# Patient Record
Sex: Female | Born: 1960 | Race: White | Hispanic: No | Marital: Married | Smoking: Never smoker
Health system: Southern US, Community
[De-identification: ages and names within clinical notes are randomized; demographics above are authoritative.]

## PROBLEM LIST (undated history)

## (undated) DIAGNOSIS — G4733 Obstructive sleep apnea (adult) (pediatric): Secondary | ICD-10-CM

## (undated) DIAGNOSIS — M8580 Other specified disorders of bone density and structure, unspecified site: Secondary | ICD-10-CM

## (undated) DIAGNOSIS — I83893 Varicose veins of bilateral lower extremities with other complications: Secondary | ICD-10-CM

## (undated) DIAGNOSIS — F329 Major depressive disorder, single episode, unspecified: Secondary | ICD-10-CM

## (undated) DIAGNOSIS — E079 Disorder of thyroid, unspecified: Secondary | ICD-10-CM

## (undated) DIAGNOSIS — E785 Hyperlipidemia, unspecified: Secondary | ICD-10-CM

## (undated) DIAGNOSIS — F32A Depression, unspecified: Secondary | ICD-10-CM

## (undated) DIAGNOSIS — K5790 Diverticulosis of intestine, part unspecified, without perforation or abscess without bleeding: Secondary | ICD-10-CM

## (undated) DIAGNOSIS — K8020 Calculus of gallbladder without cholecystitis without obstruction: Secondary | ICD-10-CM

## (undated) DIAGNOSIS — E669 Obesity, unspecified: Secondary | ICD-10-CM

## (undated) DIAGNOSIS — N6019 Diffuse cystic mastopathy of unspecified breast: Secondary | ICD-10-CM

## (undated) DIAGNOSIS — G2581 Restless legs syndrome: Secondary | ICD-10-CM

## (undated) DIAGNOSIS — J302 Other seasonal allergic rhinitis: Secondary | ICD-10-CM

## (undated) DIAGNOSIS — M1990 Unspecified osteoarthritis, unspecified site: Secondary | ICD-10-CM

## (undated) DIAGNOSIS — N319 Neuromuscular dysfunction of bladder, unspecified: Secondary | ICD-10-CM

## (undated) HISTORY — PX: LUMBAR SPINE SURGERY: SHX701

## (undated) HISTORY — DX: Obstructive sleep apnea (adult) (pediatric): G47.33

## (undated) HISTORY — DX: Varicose veins of bilateral lower extremities with other complications: I83.893

## (undated) HISTORY — DX: Neuromuscular dysfunction of bladder, unspecified: N31.9

## (undated) HISTORY — DX: Disorder of thyroid, unspecified: E07.9

## (undated) HISTORY — PX: FOOT SURGERY: SHX648

## (undated) HISTORY — DX: Obesity, unspecified: E66.9

## (undated) HISTORY — DX: Depression, unspecified: F32.A

## (undated) HISTORY — DX: Other specified disorders of bone density and structure, unspecified site: M85.80

## (undated) HISTORY — DX: Calculus of gallbladder without cholecystitis without obstruction: K80.20

## (undated) HISTORY — DX: Diverticulosis of intestine, part unspecified, without perforation or abscess without bleeding: K57.90

## (undated) HISTORY — PX: CHOLECYSTECTOMY: SHX55

## (undated) HISTORY — PX: ERCP: SHX60

## (undated) HISTORY — DX: Other seasonal allergic rhinitis: J30.2

## (undated) HISTORY — DX: Diffuse cystic mastopathy of unspecified breast: N60.19

## (undated) HISTORY — DX: Restless legs syndrome: G25.81

## (undated) HISTORY — DX: Major depressive disorder, single episode, unspecified: F32.9

## (undated) HISTORY — DX: Hyperlipidemia, unspecified: E78.5

## (undated) HISTORY — DX: Unspecified osteoarthritis, unspecified site: M19.90

---

## 1988-09-06 HISTORY — PX: APPENDECTOMY: SHX54

## 1989-07-07 HISTORY — PX: OTHER SURGICAL HISTORY: SHX169

## 1997-09-06 HISTORY — PX: OTHER SURGICAL HISTORY: SHX169

## 1997-11-26 ENCOUNTER — Ambulatory Visit (HOSPITAL_COMMUNITY): Admission: RE | Admit: 1997-11-26 | Discharge: 1997-11-26 | Payer: Self-pay | Admitting: *Deleted

## 1997-12-03 ENCOUNTER — Other Ambulatory Visit: Admission: RE | Admit: 1997-12-03 | Discharge: 1997-12-03 | Payer: Self-pay | Admitting: *Deleted

## 1998-02-11 ENCOUNTER — Other Ambulatory Visit: Admission: RE | Admit: 1998-02-11 | Discharge: 1998-02-11 | Payer: Self-pay | Admitting: *Deleted

## 1998-11-12 ENCOUNTER — Other Ambulatory Visit: Admission: RE | Admit: 1998-11-12 | Discharge: 1998-11-12 | Payer: Self-pay | Admitting: *Deleted

## 1999-11-26 ENCOUNTER — Other Ambulatory Visit: Admission: RE | Admit: 1999-11-26 | Discharge: 1999-11-26 | Payer: Self-pay | Admitting: *Deleted

## 2000-10-25 ENCOUNTER — Other Ambulatory Visit: Admission: RE | Admit: 2000-10-25 | Discharge: 2000-10-25 | Payer: Self-pay | Admitting: *Deleted

## 2000-11-29 ENCOUNTER — Encounter: Admission: RE | Admit: 2000-11-29 | Discharge: 2001-02-27 | Payer: Self-pay | Admitting: Internal Medicine

## 2001-05-03 ENCOUNTER — Encounter: Payer: Self-pay | Admitting: *Deleted

## 2001-05-03 ENCOUNTER — Ambulatory Visit (HOSPITAL_COMMUNITY): Admission: RE | Admit: 2001-05-03 | Discharge: 2001-05-03 | Payer: Self-pay | Admitting: *Deleted

## 2001-07-25 ENCOUNTER — Encounter: Admission: RE | Admit: 2001-07-25 | Discharge: 2001-10-23 | Payer: Self-pay | Admitting: Internal Medicine

## 2002-12-03 ENCOUNTER — Other Ambulatory Visit: Admission: RE | Admit: 2002-12-03 | Discharge: 2002-12-03 | Payer: Self-pay | Admitting: *Deleted

## 2003-04-18 ENCOUNTER — Ambulatory Visit (HOSPITAL_COMMUNITY): Admission: RE | Admit: 2003-04-18 | Discharge: 2003-04-18 | Payer: Self-pay | Admitting: *Deleted

## 2003-04-18 ENCOUNTER — Encounter: Payer: Self-pay | Admitting: *Deleted

## 2003-04-23 ENCOUNTER — Encounter: Admission: RE | Admit: 2003-04-23 | Discharge: 2003-04-23 | Payer: Self-pay | Admitting: *Deleted

## 2003-04-23 ENCOUNTER — Encounter: Payer: Self-pay | Admitting: *Deleted

## 2003-12-24 ENCOUNTER — Other Ambulatory Visit: Admission: RE | Admit: 2003-12-24 | Discharge: 2003-12-24 | Payer: Self-pay | Admitting: *Deleted

## 2004-01-16 ENCOUNTER — Encounter: Admission: RE | Admit: 2004-01-16 | Discharge: 2004-01-16 | Payer: Self-pay | Admitting: Internal Medicine

## 2004-05-04 ENCOUNTER — Ambulatory Visit (HOSPITAL_COMMUNITY): Admission: RE | Admit: 2004-05-04 | Discharge: 2004-05-04 | Payer: Self-pay | Admitting: *Deleted

## 2004-12-16 ENCOUNTER — Other Ambulatory Visit: Admission: RE | Admit: 2004-12-16 | Discharge: 2004-12-16 | Payer: Self-pay | Admitting: *Deleted

## 2005-05-05 ENCOUNTER — Ambulatory Visit (HOSPITAL_COMMUNITY): Admission: RE | Admit: 2005-05-05 | Discharge: 2005-05-05 | Payer: Self-pay | Admitting: *Deleted

## 2006-02-14 ENCOUNTER — Other Ambulatory Visit: Admission: RE | Admit: 2006-02-14 | Discharge: 2006-02-14 | Payer: Self-pay | Admitting: *Deleted

## 2006-05-06 ENCOUNTER — Ambulatory Visit (HOSPITAL_COMMUNITY): Admission: RE | Admit: 2006-05-06 | Discharge: 2006-05-06 | Payer: Self-pay | Admitting: Family Medicine

## 2007-01-25 ENCOUNTER — Other Ambulatory Visit: Admission: RE | Admit: 2007-01-25 | Discharge: 2007-01-25 | Payer: Self-pay | Admitting: *Deleted

## 2007-01-26 ENCOUNTER — Encounter: Admission: RE | Admit: 2007-01-26 | Discharge: 2007-01-26 | Payer: Self-pay | Admitting: Internal Medicine

## 2007-04-26 ENCOUNTER — Ambulatory Visit: Payer: Self-pay | Admitting: Internal Medicine

## 2007-05-02 ENCOUNTER — Ambulatory Visit (HOSPITAL_COMMUNITY): Admission: RE | Admit: 2007-05-02 | Discharge: 2007-05-02 | Payer: Self-pay | Admitting: Internal Medicine

## 2007-05-09 ENCOUNTER — Ambulatory Visit (HOSPITAL_COMMUNITY): Admission: RE | Admit: 2007-05-09 | Discharge: 2007-05-09 | Payer: Self-pay | Admitting: *Deleted

## 2007-12-08 DIAGNOSIS — E039 Hypothyroidism, unspecified: Secondary | ICD-10-CM | POA: Insufficient documentation

## 2008-05-20 ENCOUNTER — Ambulatory Visit (HOSPITAL_COMMUNITY): Admission: RE | Admit: 2008-05-20 | Discharge: 2008-05-20 | Payer: Self-pay | Admitting: Internal Medicine

## 2008-06-10 ENCOUNTER — Ambulatory Visit: Payer: Self-pay | Admitting: Sports Medicine

## 2008-06-10 DIAGNOSIS — M7040 Prepatellar bursitis, unspecified knee: Secondary | ICD-10-CM | POA: Insufficient documentation

## 2008-06-10 DIAGNOSIS — M2170 Unequal limb length (acquired), unspecified site: Secondary | ICD-10-CM | POA: Insufficient documentation

## 2008-06-10 DIAGNOSIS — M533 Sacrococcygeal disorders, not elsewhere classified: Secondary | ICD-10-CM | POA: Insufficient documentation

## 2008-06-14 ENCOUNTER — Ambulatory Visit: Payer: Self-pay | Admitting: Sports Medicine

## 2008-06-14 DIAGNOSIS — M76899 Other specified enthesopathies of unspecified lower limb, excluding foot: Secondary | ICD-10-CM | POA: Insufficient documentation

## 2008-06-14 DIAGNOSIS — M25469 Effusion, unspecified knee: Secondary | ICD-10-CM | POA: Insufficient documentation

## 2008-08-26 ENCOUNTER — Emergency Department (HOSPITAL_COMMUNITY): Admission: EM | Admit: 2008-08-26 | Discharge: 2008-08-26 | Payer: Self-pay | Admitting: Emergency Medicine

## 2009-05-23 ENCOUNTER — Ambulatory Visit (HOSPITAL_COMMUNITY): Admission: RE | Admit: 2009-05-23 | Discharge: 2009-05-23 | Payer: Self-pay | Admitting: Obstetrics and Gynecology

## 2010-06-25 ENCOUNTER — Ambulatory Visit (HOSPITAL_COMMUNITY): Admission: RE | Admit: 2010-06-25 | Discharge: 2010-06-25 | Payer: Self-pay | Admitting: Obstetrics and Gynecology

## 2010-09-26 ENCOUNTER — Encounter: Payer: Self-pay | Admitting: Obstetrics and Gynecology

## 2011-01-19 NOTE — Assessment & Plan Note (Signed)
[facility]                         GASTROENTEROLOGY OFFICE NOTE   NAME:Deborah Perkins, Deborah Perkins                         MRN:          [id number]  DATE:05/03/2007                            DOB:          February 04, 1961    Ms. Deshmukh is a 50 year old white female whom we saw in consultation from  Dr. Felipa Eth on April 26, 2007 because of intermittent abdominal  discomfort.  I dictated my consultation note at that time.  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  On x-ray her abdominal  discomfort was correlating with descending colon and adjacent small  bowels.  There was no evidence of obstruction, and the transit  time was  normal.   I have spoken to the patient today and explained the results of her  small bowel followthrough.her  pain is  in some way related to the  malrotation of the small bowel possibly due to adhesions or due to  previous LADDS procedure which might have created some bowel adhesion.  She reported improvement of the pain with Bentyl , an antispasmodic.  I  have told her that as long as there is no obstruction nothing has to be  done, but over a period of  next six months or year if her pain becomes  progressively worse or more frequent that we will have to evaluate her  further.  I would prefer to wait with colonoscopy until she is 50 years  old, but if the pain on the left side continues I would consider  colonoscopy to rule out any structural problems due to the left colon.     [name], MD  Electronically Signed    DMB/MedQ  DD: 05/03/2007  DT: 05/04/2007  Job #: [id number]   cc:   Larina Earthly, M.D.

## 2011-01-19 NOTE — Assessment & Plan Note (Signed)
[facility]                         GASTROENTEROLOGY OFFICE NOTE   NAME:Perkins, Deborah SMART                         MRN:          [id number]  DATE:04/26/2007                            DOB:          07/10/1961    Deborah Perkins is a very nice 50 year old white female who is here today for  evaluation of left lower quadrant abdominal discomfort, which occurs  intermittently off and on for the past 6 months.  It is located  anteriorly in the left middle and left lower quadrant, and most recently  has been associated with nausea.  She had a gynecologic evaluation by  Dr. Jeanine Luz, who did a pelvic ultrasound, and noticed increased  amount of stool in the left colon.  The pain seems to be improved when  she lies down, or if she puts pressure on her colon.  She has had  occasional constipation.  Once in a while she develops sudden onset of  urgent diarrhea, which may be quite stressful, especially when she is  out walking a dog, or so.  The pain also seems to be worse when she does  yard work and bends over or carries heavy things.  There is no family  history of colon cancer or colon polyps.  The pertinent history is that  at the age of 50 she presented with acute abdominal pain, which was  thought to be appendicitis, but it actually turned out to be malrotation  of the entire small bowel, and I think of the colon as well.  We have  the operative report from Dr. Wiliam Ke, who repaired the malrotation with a  LADDS procedure.  She has been well after the procedure without any  abdominal pain.   MEDICATIONS:  1. Synthroid 88 mcg p.o. daily.  2. Glucosamine.  3. Aspirin 81 mg p.o. daily.  4. Multiple vitamin.   PAST MEDICAL HISTORY:  Significant for thyroid problems.   OPERATIONS:  Abdominal exploratory laparotomy and the LADDS procedure  for an appendectomy for malrotated small bowel.   FAMILY HISTORY:  Positive for diabetes in maternal grandmother.  Breast  cancer in mother.   SOCIAL HISTORY:  Married with 2 children.  She has a Naval architect.  Does not work at the present time.  She does not smoke, and drinks  alcohol only occasionally.   REVIEW OF SYSTEMS:  Weight gain from about 200 pounds to currently 215  pounds.  She has night sweats.  She is postmenopausal.   PHYSICAL EXAMINATION:  Blood pressure 110/72.  Pulse 72.  Weight 213  pounds.  She was alert, oriented, and healthy appearing.  LUNGS:  Clear to auscultation.  COR:  With normal S1 and S2.  ABDOMEN:  Soft and nontender.  Right upper quadrant was unremarkable  with normoactive bowel sounds.  Liver edge was at costal margin.  Lower  abdomen was normal.  I could not reproduce left lower quadrant  discomfort today.  She was pain free today.  There was no fullness or  distention in the left lower quadrant.  I could not appreciate any  hernia.  Straight leg raising was negative.  RECTAL EXAM:  Normal rectal tone.  Stool was soft and Hemoccult  negative.   LABORATORY DATA:  From Dr. Felipa Eth showed mild elevation of alkaline  phosphatase at 140 with normal AST and ALT.  Normal hemoglobin at 14.8.  TSH of 1.5.  Urinalysis was negative.   IMPRESSION:  A 50 year old white female with intermittent left lower  quadrant abdominal discomfort.  Because of intermittent nature, one  would question possibility of colon spasm, irritable syndrome, or  possible diverticulosis.  In view of the past history of malrotated  small bowel and surgical correction, her left colon may be displaced,  and she may be having some adhesions within the pelvis that is causing  intermittent pain.  She is Hemoccult negative, and not anemic.  Her  symptoms are not suggestive of inflammatory bowel disease or ischemic  colitis.  The nausea may be part of the syndrome, or possibly a separate  problem.  She is overweight, and has elevated alkaline phosphatase.  I  wonder if we may not be dealing with symptomatic  gallbladder disease.   PLAN:  1. Upper abdominal ultrasound of the gallbladder.  2. Small bowel follow through to delineate the course of her small      bowel and the colon.  She will eventually need a colonoscopy once      we determine the position of her colon and the small bowel.  3. Samples of Prilosec 20 mg daily.  4. Bentyl 10 mg twice a day p.r.n. abdominal pain.  5. High-fiber diet.  6. Depending on the results and response with treatment, we will make      further disposition.     Deborah Morton. Juanda Chance, MD  Electronically Signed    DMB/MedQ  DD: 04/26/2007  DT: 04/27/2007  Job #: [id number]   cc:   Larina Earthly, M.D.

## 2011-05-20 ENCOUNTER — Other Ambulatory Visit (HOSPITAL_COMMUNITY): Payer: Self-pay | Admitting: Obstetrics and Gynecology

## 2011-05-20 DIAGNOSIS — Z1231 Encounter for screening mammogram for malignant neoplasm of breast: Secondary | ICD-10-CM

## 2011-05-24 ENCOUNTER — Other Ambulatory Visit: Payer: Self-pay | Admitting: Obstetrics & Gynecology

## 2011-05-24 DIAGNOSIS — Z780 Asymptomatic menopausal state: Secondary | ICD-10-CM

## 2011-05-24 DIAGNOSIS — N6321 Unspecified lump in the left breast, upper outer quadrant: Secondary | ICD-10-CM

## 2011-05-28 ENCOUNTER — Ambulatory Visit (HOSPITAL_COMMUNITY)
Admission: RE | Admit: 2011-05-28 | Discharge: 2011-05-28 | Disposition: A | Discharge status: Home / Self Care | Payer: 59 | Source: Ambulatory Visit | Attending: Obstetrics & Gynecology | Admitting: Obstetrics & Gynecology

## 2011-05-28 DIAGNOSIS — Z13820 Encounter for screening for osteoporosis: Secondary | ICD-10-CM | POA: Insufficient documentation

## 2011-05-28 DIAGNOSIS — Z780 Asymptomatic menopausal state: Secondary | ICD-10-CM | POA: Insufficient documentation

## 2011-06-01 ENCOUNTER — Ambulatory Visit
Admission: RE | Admit: 2011-06-01 | Discharge: 2011-06-01 | Disposition: A | Discharge status: Home / Self Care | Payer: 59 | Source: Ambulatory Visit | Attending: Obstetrics & Gynecology | Admitting: Obstetrics & Gynecology

## 2011-06-01 ENCOUNTER — Other Ambulatory Visit: Payer: Self-pay | Admitting: Obstetrics & Gynecology

## 2011-06-01 DIAGNOSIS — N6321 Unspecified lump in the left breast, upper outer quadrant: Secondary | ICD-10-CM

## 2011-06-28 ENCOUNTER — Ambulatory Visit (HOSPITAL_COMMUNITY): Payer: 59

## 2011-07-08 ENCOUNTER — Encounter (INDEPENDENT_AMBULATORY_CARE_PROVIDER_SITE_OTHER): Payer: Self-pay | Admitting: Surgery

## 2011-07-08 ENCOUNTER — Ambulatory Visit (INDEPENDENT_AMBULATORY_CARE_PROVIDER_SITE_OTHER): Payer: 59 | Admitting: Surgery

## 2011-07-08 VITALS — BP 130/86 | HR 66 | Temp 97.4°F | Resp 14 | Ht 67.0 in | Wt 188.8 lb

## 2011-07-08 DIAGNOSIS — N6019 Diffuse cystic mastopathy of unspecified breast: Secondary | ICD-10-CM

## 2011-07-08 NOTE — Progress Notes (Signed)
NAME: Deborah Perkins                                                                                      DOB: June 09, 1961 DATE: 07/08/2011               MRN: [id number]   CC:  Chief Complaint  Patient presents with  . Other    Breast papilloma. [place] and MGM done. Notes to be faxed.    HPI:  Deborah Perkins is a 50 y.o.  female who presents with A question of a left breast mass that was noted by her gynecologist. She apparently had something noted in September in the upper outer quadrant of the left breast and now something in the upper inner quadrant of the left breast. The patient is a little bit of tenderness but hasn't really had a nodular mass that she can feel herself. Her mother has breast cancer and has been a patient of mine. She's had no prior breast problems.  PMH:   has a past medical history of Thyroid disease.  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  ALLERGIES:  No Known Allergies  MEDICATIONS:   Current Outpatient Prescriptions  Medication Sig Dispense Refill  . buPROPion (WELLBUTRIN XL) 300 MG 24 hr tablet 300 mg every 7 (seven) days.       . Calcium Carbonate-Vitamin D (CALTRATE 600+D PO) Take by mouth daily.        Marland Kitchen ipratropium (ATROVENT) 0.06 % nasal spray 1 spray as needed.       . Multiple Vitamins-Calcium (ONE-A-DAY WOMENS FORMULA PO) Take by mouth daily.        Marland Kitchen SYNTHROID 88 MCG tablet 88 mcg daily.       . valACYclovir (VALTREX) 1000 MG tablet 1,000 mg as needed.       . zolpidem (AMBIEN) 10 MG tablet 10 mg as needed.       . ALPRAZolam (XANAX) 0.5 MG tablet 0.5 mg as needed.       Marland Kitchen azithromycin (ZITHROMAX) 250 MG tablet 250 mg once.         ROS: Her 12 point ROS from her paperwork is negative.  EXAM:   GENERAL:  The patient is alert, oriented, and generally healthy-appearing, NAD. Mood and affect are normal.   BREASTS:  The breasts are basically symmetrical and normal. In the left breast upper  inner quadrant is a somewhat around the nodular density that measures about 1 x 2.5 cm. It feels like fibrocystic disease. Both breasts have some nodularity consistent with fibrocystic disease. Skin nipple and areolar areas are normal.  DATA REVIEWED:   I reviewed the mammogram and ultrasound reports as well as the notes from Dr. Hyacinth Meeker  IMPRESSION:  I think this is fibrocystic disease with some fibrocystic nodularity. I don't believe this is malignant.  PLAN:   I will see her again to recheck the area in three months

## 2011-07-08 NOTE — Patient Instructions (Signed)
I would like to recheck the area in the left breast in three months

## 2011-09-30 ENCOUNTER — Encounter (INDEPENDENT_AMBULATORY_CARE_PROVIDER_SITE_OTHER): Payer: Self-pay | Admitting: Surgery

## 2011-10-02 ENCOUNTER — Encounter: Payer: Self-pay | Admitting: Obstetrics & Gynecology

## 2011-11-24 ENCOUNTER — Encounter (INDEPENDENT_AMBULATORY_CARE_PROVIDER_SITE_OTHER): Payer: Self-pay | Admitting: Surgery

## 2011-11-24 ENCOUNTER — Ambulatory Visit (INDEPENDENT_AMBULATORY_CARE_PROVIDER_SITE_OTHER): Payer: BC Managed Care – PPO | Admitting: Surgery

## 2011-11-24 VITALS — BP 122/78 | HR 80 | Resp 16 | Ht 67.0 in | Wt 191.0 lb

## 2011-11-24 DIAGNOSIS — N6019 Diffuse cystic mastopathy of unspecified breast: Secondary | ICD-10-CM

## 2011-11-24 NOTE — Patient Instructions (Signed)
Continue to have annual mammograms We will see you again on an as needed basis. Please call the office at [phone] if you have any questions or concerns. Thank you for allowing us to take care of you.  

## 2011-11-24 NOTE — Progress Notes (Signed)
Chief complaint: Followup fibrocystic breast disease  History of present illness: I saw this patient in the fall with some irregularity to the breast tissue in the upper outer quadrant of the left breast. Mammogram and ultrasound were unremarkable. She was asked to come back now for a followup physical exam. She has noticed no changes in her breast in the interim.  Past history, family history, review of systems are noted in the electronic medical record. She brought information in about her surgery in 1990 for a malrotation. I reviewed that with her.  Exam: Gen.: Patient is alert oriented healthy appearing, NAD. Breasts: The breasts are symmetric in appearance. There is no dominant mass on either side. There is no tenderness. Both breasts are dense consistent with mild fibrocystic changes. There is no particular irregularity noted.  Data reviewed: No new data about her breast. Preop note from her 1990 surgery.  Impression: Fibrocystic changes.  Plan: We will see her here p.r.n. She will have her routine mammogram in September.

## 2012-05-31 ENCOUNTER — Encounter: Payer: Self-pay | Admitting: Internal Medicine

## 2012-05-31 ENCOUNTER — Ambulatory Visit (AMBULATORY_SURGERY_CENTER): Payer: Self-pay | Admitting: *Deleted

## 2012-05-31 VITALS — Ht 67.0 in | Wt 186.2 lb

## 2012-05-31 DIAGNOSIS — Z1211 Encounter for screening for malignant neoplasm of colon: Secondary | ICD-10-CM

## 2012-05-31 MED ORDER — MOVIPREP 100 G PO SOLR: 1.0000 | Freq: Once | ORAL | Status: DC

## 2012-06-01 ENCOUNTER — Other Ambulatory Visit: Payer: Self-pay | Admitting: Obstetrics & Gynecology

## 2012-06-01 DIAGNOSIS — Z1231 Encounter for screening mammogram for malignant neoplasm of breast: Secondary | ICD-10-CM

## 2012-06-14 ENCOUNTER — Ambulatory Visit (AMBULATORY_SURGERY_CENTER): Payer: BC Managed Care – PPO | Admitting: Internal Medicine

## 2012-06-14 ENCOUNTER — Encounter: Payer: Self-pay | Admitting: Internal Medicine

## 2012-06-14 VITALS — BP 119/74 | HR 73 | Temp 98.7°F | Resp 9 | Ht 67.0 in | Wt 186.0 lb

## 2012-06-14 DIAGNOSIS — Z1211 Encounter for screening for malignant neoplasm of colon: Secondary | ICD-10-CM

## 2012-06-14 MED ORDER — SODIUM CHLORIDE 0.9 % IV SOLN: 500.0000 mL | INTRAVENOUS | Status: DC

## 2012-06-14 NOTE — Progress Notes (Signed)
No complaints noted in the recovery room. Maw  Patient did not experience any of the following events: a burn prior to discharge; a fall within the facility; wrong site/side/patient/procedure/implant event; or a hospital transfer or hospital admission upon discharge from the facility. (G8907) Patient did not have preoperative order for IV antibiotic SSI prophylaxis. (G8918)  

## 2012-06-14 NOTE — Patient Instructions (Addendum)
You may resume your current medications today.  Please call if any questions or concerns.    YOU HAD AN ENDOSCOPIC PROCEDURE TODAY AT THE [facility]: Refer to the procedure report that was given to you for any specific questions about what was found during the examination.  If the procedure report does not answer your questions, please call your gastroenterologist to clarify.  If you requested that your care partner not be given the details of your procedure findings, then the procedure report has been included in a sealed envelope for you to review at your convenience later.  YOU SHOULD EXPECT: Some feelings of bloating in the abdomen. Passage of more gas than usual.  Walking can help get rid of the air that was put into your GI tract during the procedure and reduce the bloating. If you had a lower endoscopy (such as a colonoscopy or flexible sigmoidoscopy) you may notice spotting of blood in your stool or on the toilet paper. If you underwent a bowel prep for your procedure, then you may not have a normal bowel movement for a few days.  DIET: Your first meal following the procedure should be a light meal and then it is ok to progress to your normal diet.  A half-sandwich or bowl of soup is an example of a good first meal.  Heavy or fried foods are harder to digest and may make you feel nauseous or bloated.  Likewise meals heavy in dairy and vegetables can cause extra gas to form and this can also increase the bloating.  Drink plenty of fluids but you should avoid alcoholic beverages for 24 hours.  ACTIVITY: Your care partner should take you home directly after the procedure.  You should plan to take it easy, moving slowly for the rest of the day.  You can resume normal activity the day after the procedure however you should NOT DRIVE or use heavy machinery for 24 hours (because of the sedation medicines used during the test).    SYMPTOMS TO REPORT IMMEDIATELY: A gastroenterologist can be  reached at any hour.  During normal business hours, 8:30 AM to 5:00 PM Monday through Friday, call [phone].  After hours and on weekends, please call the GI answering service at [phone] who will take a message and have the physician on call contact you.   Following lower endoscopy (colonoscopy or flexible sigmoidoscopy):  Excessive amounts of blood in the stool  Significant tenderness or worsening of abdominal pains  Swelling of the abdomen that is new, acute  Fever of 100F or higher   FOLLOW UP: If any biopsies were taken you will be contacted by phone or by letter within the next 1-3 weeks.  Call your gastroenterologist if you have not heard about the biopsies in 3 weeks.  Our staff will call the home number listed on your records the next business day following your procedure to check on you and address any questions or concerns that you may have at that time regarding the information given to you following your procedure. This is a courtesy call and so if there is no answer at the home number and we have not heard from you through the emergency physician on call, we will assume that you have returned to your regular daily activities without incident.  SIGNATURES/CONFIDENTIALITY: You and/or your care partner have signed paperwork which will be entered into your electronic medical record.  These signatures attest to the fact that that the information above   on your After Visit Summary has been reviewed and is understood.  Full responsibility of the confidentiality of this discharge information lies with you and/or your care-partner. 

## 2012-06-14 NOTE — Op Note (Signed)
[facility] [address] [place], [address]   COLONOSCOPY PROCEDURE REPORT  PATIENT: Deborah Perkins, Deborah Perkins  MR#: [id number] BIRTHDATE: Aug 20, 1961 , 51  yrs. old GENDER: Female ENDOSCOPIST: Hart Carwin, MD REFERRED XB:[id number] Avva, M.D. PROCEDURE DATE:  06/14/2012 PROCEDURE:   Colonoscopy, screening ASA CLASS:   Class I INDICATIONS:average risk patient for colon cancer. MEDICATIONS: MAC sedation, administered by CRNA and Propofol (Diprivan) 420 mg IV  DESCRIPTION OF PROCEDURE:   After the risks benefits and alternatives of the procedure were thoroughly explained, informed consent was obtained.  A digital rectal exam revealed no abnormalities of the rectum.   The LB [id number]-[id number]  endoscope was introduced through the anus and advanced to the cecum, which was identified by both the appendix and ileocecal valve. No adverse events experienced.   The quality of the prep was excellent, using MoviPrep  The instrument was then slowly withdrawn as the colon was fully examined.      COLON FINDINGS: A normal appearing cecum, ileocecal valve, and appendiceal orifice were identified.  The ascending, hepatic flexure, transverse, splenic flexure, descending, sigmoid colon and rectum appeared unremarkable.  No polyps or cancers were seen. Retroflexed views revealed no abnormalities. The time to cecum=  . Withdrawal time=  .  The scope was withdrawn and the procedure completed. COMPLICATIONS: There were no complications.  ENDOSCOPIC IMPRESSION: Normal colon  RECOMMENDATIONS: high fiber diet   eSigned:  Hart Carwin, MD 06/14/2012 10:54 AM   cc:

## 2012-06-15 ENCOUNTER — Telehealth: Payer: Self-pay | Admitting: *Deleted

## 2012-06-15 NOTE — Telephone Encounter (Signed)
  Follow up Call-  Call back number 06/14/2012  Post procedure Call Back phone  # [phone] cell  Permission to leave phone message Yes     Patient questions:  Do you have a fever, pain , or abdominal swelling? yes Pain Score  3 *  Have you tolerated food without any problems? yes  Have you been able to return to your normal activities? yes  Do you have any questions about your discharge instructions: Diet   no Medications  no Follow up visit  no  Do you have questions or concerns about your Care? no  Actions: * If pain score is 4 or above: Pt reports her back is hurting today but she believes it is from laying around most of the day yesterday. She is at work this morning. Asked her to call if pain worsens or does not improve over the next couple of days. No action needed, pain <4.

## 2012-06-19 ENCOUNTER — Ambulatory Visit
Admission: RE | Admit: 2012-06-19 | Discharge: 2012-06-19 | Disposition: A | Discharge status: Home / Self Care | Payer: BC Managed Care – PPO | Source: Ambulatory Visit | Attending: Obstetrics & Gynecology | Admitting: Obstetrics & Gynecology

## 2012-06-19 DIAGNOSIS — Z1231 Encounter for screening mammogram for malignant neoplasm of breast: Secondary | ICD-10-CM

## 2013-07-11 ENCOUNTER — Ambulatory Visit (INDEPENDENT_AMBULATORY_CARE_PROVIDER_SITE_OTHER): Payer: 59 | Admitting: Neurology

## 2013-07-11 ENCOUNTER — Encounter (INDEPENDENT_AMBULATORY_CARE_PROVIDER_SITE_OTHER): Payer: Self-pay

## 2013-07-11 ENCOUNTER — Encounter: Payer: Self-pay | Admitting: Neurology

## 2013-07-11 VITALS — BP 114/72 | HR 81 | Temp 96.9°F | Ht 67.0 in | Wt 227.0 lb

## 2013-07-11 DIAGNOSIS — R0609 Other forms of dyspnea: Secondary | ICD-10-CM

## 2013-07-11 DIAGNOSIS — R0683 Snoring: Secondary | ICD-10-CM

## 2013-07-11 DIAGNOSIS — G479 Sleep disorder, unspecified: Secondary | ICD-10-CM

## 2013-07-11 DIAGNOSIS — E669 Obesity, unspecified: Secondary | ICD-10-CM

## 2013-07-11 NOTE — Progress Notes (Signed)
Subjective:    Patient ID: Deborah Perkins is a 52 y.o. female.  HPI  Deborah Foley, MD, PhD [facility] [address] [address] [place], [place] [address]  Dear Dr. Felipa Eth,   I saw your patient, Deborah Perkins, upon your kind request in my neurologic clinic today for initial consultation of her sleep disorder, in particular, concern for obstructive sleep apnea. The patient is unaccompanied today. As you know, Deborah Perkins is a very friendly 52 year old right-handed woman with an underlying medical history of allergic rhinitis, depression, bladder dysfunction, degenerative joint disease, hyperlipidemia, obesity, hypothyroidism, and varicose veins (s/p laser surgery), who complains of nonrestorative sleep and snoring as well as restless sleep.   She goes to bed by 10 to 10:15 PM and watches TV, but turns it off. She has to get up for work at 5:30 and generally speaking, feels fairly well rested. She was given Ambien some time some 3-4 years ago, when she was going through a stressful time and uses it sparingly some 3 or 4 nights per week at 5 mg strength and while she does not have a problem going to sleep she sometimes has trouble staying asleep and Ambien does help with that. She also takes benadryl 25 mg each night for nasal congestion and allergy symptoms.  Her mother is 19 yo and has OSA, on CPAP and her father is 8, and has migraines. She has one older sister who is healthy. She works fulltime in Education officer, environmental. She is non-smoker and drinks red wine one glass each night. She walks her dog twice daily, 2 1/2 miles each day.  In the last 12 months, she has gained weight, especially since she started the Zoloft at night, which is 50 mg.  She denies frank restless leg symptoms but has been told that at times she jerks or kicks in her sleep. This is not a nightly occurrence. She does not wake up with any severe joint pain but does have trouble with her right hip at times and often  sleeps with a wedge or pillow between both knees. She is a side sleeper. She does wake up several times per night but does not have to go to the bathroom regularly. She denies morning headaches. She denies parasomnias. Snoring can be loud per husband's and her children's report. She does not typically take any naps and denies frank daytime somnolence. Her Epworth sleepiness score today is 4 out of a maximum of 24. She does have some allergy symptoms and rarely coughs at night. She denies acid reflux symptoms at night. She is a very restless sleeper as observed by her husband. He has never observed any apneas and she endorses rare gasping or choking sensations at night. This is not the night tonight occurrence. She denies cataplexy, sleep paralysis or nighttime hallucinations. She reports tooth grinding. She has recently seen her dentist who would like to hold off on providing her with a bite guard until after her sleep study because she may end up using a oral appliance for OSA.  Her Past Medical History Is Significant For: Past Medical History  Diagnosis Date  . Thyroid disease   . Depression   . Seasonal allergies   . Osteopenia     Her Past Surgical History Is Significant For: Past Surgical History  Procedure Laterality Date  . Foot surgery      multiple surgeries on both feet  . 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 Appendectomy  1990    done when operated for malrotation,     Her Family History Is Significant For: Family History  Problem Relation Age of Onset  . Cancer Mother     breast  . Colon cancer Neg Hx   . Esophageal cancer Neg Hx   . Rectal cancer Neg Hx   . Stomach cancer Neg Hx     Her Social History Is Significant For: History   Social History  . Marital Status: Married    Spouse Name: N/A    Number of Children: N/A  . Years of Education: N/A   Social History Main Topics  . Smoking status: Never  Smoker   . Smokeless tobacco: Never Used  . Alcohol Use: 2.4 oz/week    4 Glasses of wine per week     Comment: occasional glass of wine  . Drug Use: No  . Sexual Activity: None   Other Topics Concern  . None   Social History Narrative  . None    Her Allergies Are:  No Known Allergies:   Her Current Medications Are:  Outpatient Encounter Prescriptions as of 07/11/2013  Medication Sig  . ALPRAZolam (XANAX) 0.5 MG tablet 0.5 mg as needed.   Marland Kitchen BIOTIN FORTE PO Take by mouth.  Marland Kitchen buPROPion (WELLBUTRIN XL) 300 MG 24 hr tablet 300 mg every 7 (seven) days.   . Calcium Carbonate-Vitamin D (CALTRATE 600+D PO) Take by mouth daily.    Marland Kitchen LYSINE PO Take by mouth.  . Multiple Vitamins-Calcium (ONE-A-DAY WOMENS FORMULA PO) Take by mouth daily.    . Multiple Vitamins-Minerals (ZINC PO) Take by mouth.  . sertraline (ZOLOFT) 50 MG tablet   . SYNTHROID 88 MCG tablet 88 mcg daily.   . valACYclovir (VALTREX) 1000 MG tablet 1,000 mg as needed.   . zolpidem (AMBIEN) 10 MG tablet 10 mg as needed.   :  Review of Systems:  Out of a complete 14 point review of systems, all are reviewed and negative with the exception of these symptoms as listed below:  Review of Systems  Constitutional: Positive for unexpected weight change (gain).  HENT: Negative.   Eyes: Negative.   Respiratory:       Snoring  Cardiovascular: Negative.   Gastrointestinal: Negative.   Endocrine: Positive for heat intolerance.  Genitourinary: Negative.   Musculoskeletal: Negative.   Skin: Negative.   Allergic/Immunologic: Negative.   Neurological: Negative.   Hematological: Negative.   Psychiatric/Behavioral: Positive for sleep disturbance.    Objective:  Neurologic Exam  Physical Exam Physical Examination:   Filed Vitals:   07/11/13 0932  BP: 114/72  Pulse: 81  Temp: 96.9 F (36.1 C)    General Examination: The patient is a very pleasant 52 y.o. female in no acute distress. She appears well-developed and  well-nourished and well groomed. She is obese.  HEENT: Normocephalic, atraumatic, pupils are equal, round and reactive to light and accommodation. Funduscopic exam is normal with sharp disc margins noted. Extraocular tracking is good without limitation to gaze excursion or nystagmus noted. Normal smooth pursuit is noted. Hearing is grossly intact. Tympanic membranes are clear bilaterally. Face is symmetric with normal facial animation and normal facial sensation. Speech is clear with no dysarthria noted. There is no hypophonia. There is no lip, neck/head, jaw or voice tremor. Neck is supple with full range of passive and active motion. There are no carotid bruits on auscultation. Oropharynx exam reveals: mild mouth dryness,  good dental hygiene and mild airway crowding, due to a narrow airway entry and thicker soft palate. Mallampati is class II. Tongue protrudes centrally and palate elevates symmetrically. Tonsils are small. Neck size is 15.5 inches. She has a mild overbite.   Chest: Clear to auscultation without wheezing, rhonchi or crackles noted.  Heart: S1+S2+0, regular and normal without murmurs, rubs or gallops noted.   Abdomen: Soft, non-tender and non-distended with normal bowel sounds appreciated on auscultation.  Extremities: There is no pitting edema in the distal lower extremities bilaterally. Pedal pulses are intact.  Skin: Warm and dry without trophic changes noted. There are no varicose veins; she has spider veins.  Musculoskeletal: exam reveals no obvious joint deformities, tenderness or joint swelling or erythema.   Neurologically:  Mental status: The patient is awake, alert and oriented in all 4 spheres. Her memory, attention, language and knowledge are appropriate. There is no aphasia, agnosia, apraxia or anomia. Speech is clear with normal prosody and enunciation. Thought process is linear. Mood is congruent and affect is normal.  Cranial nerves are as described above under  HEENT exam. In addition, shoulder shrug is normal with equal shoulder height noted. Motor exam: Normal bulk, strength and tone is noted. There is no drift, tremor or rebound. Romberg is negative. Reflexes are 2+ throughout. Toes are downgoing bilaterally. Fine motor skills are intact with normal finger taps, normal hand movements, normal rapid alternating patting, normal foot taps and normal foot agility.  Cerebellar testing shows no dysmetria or intention tremor on finger to nose testing. Heel to shin is unremarkable bilaterally. There is no truncal or gait ataxia.  Sensory exam is intact to light touch, pinprick, vibration, temperature sense and proprioception in the upper and lower extremities.  Gait, station and balance are unremarkable. No veering to one side is noted. No leaning to one side is noted. Posture is age-appropriate and stance is narrow based. No problems turning are noted. She turns en bloc. Tandem walk is unremarkable. Intact toe and heel stance is noted.               Assessment and Plan:   In summary, Deborah Perkins is a very pleasant 52 y.o.-year old female with a history of allergic rhinitis, depression, bladder dysfunction, degenerative joint disease, hyperlipidemia, obesity, and hypothyroidism, with a history and and physical exam concerning for obstructive sleep apnea (OSA), based on her history of at times loud snoring, restless sleep, and given the finding of obesity as well as tighter appearing airway. I had a long chat with the patient about my findings and the diagnosis of OSA, its prognosis and treatment options. We talked about medical treatments and non-pharmacological approaches. I explained in particular the risks and ramifications of untreated moderate to severe OSA, especially with respect to developing cardiovascular disease down the Road, including congestive heart failure, difficult to treat hypertension, cardiac arrhythmias, or stroke. Even type 2 diabetes has in part  been linked to untreated OSA. We talked about trying to maintain a healthy lifestyle in general, as well as the importance of weight control. I encouraged the patient to eat healthy, exercise daily and keep well hydrated, to keep a scheduled bedtime and wake time routine, to not skip any meals and eat healthy snacks in between meals.  I recommended the following at this time: sleep study with potential positive airway pressure titration. While she indicates that she would be reluctant to pursue CPAP as a treatment options she would be open to trying  it. Furthermore, she has discussed with her dentist for possible use of a dental device. I explained the sleep test procedure to the patient and also outlined possible surgical and non-surgical treatment options of OSA, including the use of a custom-made dental device, upper airway surgical options, such as pillar implants, radiofrequency surgery, tongue base surgery, and UPPP. I also explained the CPAP treatment option to the patient, who indicated that she would be willing to try CPAP if the need arises. I explained the importance of being compliant with PAP treatment, not only for insurance purposes but primarily to improve Her symptoms, and for the patient's long term health benefit, including to reduce Her cardiovascular risks. I answered all her questions today and the patient was in agreement. I would like to see her back after the sleep study is completed and encouraged her to call with any interim questions, concerns, problems or updates.  Thank you very much for allowing me to participate in the care of this nice patient. If I can be of any further assistance to you please do not hesitate to call me at [phone].  Sincerely,   Deborah Foley, MD, PhD

## 2013-07-11 NOTE — Patient Instructions (Signed)

## 2013-07-20 ENCOUNTER — Ambulatory Visit (INDEPENDENT_AMBULATORY_CARE_PROVIDER_SITE_OTHER): Payer: BC Managed Care – PPO

## 2013-07-20 DIAGNOSIS — G4733 Obstructive sleep apnea (adult) (pediatric): Secondary | ICD-10-CM

## 2013-07-20 DIAGNOSIS — R0683 Snoring: Secondary | ICD-10-CM

## 2013-07-20 DIAGNOSIS — G4761 Periodic limb movement disorder: Secondary | ICD-10-CM

## 2013-07-20 DIAGNOSIS — E669 Obesity, unspecified: Secondary | ICD-10-CM

## 2013-07-20 DIAGNOSIS — G479 Sleep disorder, unspecified: Secondary | ICD-10-CM

## 2013-08-01 ENCOUNTER — Telehealth: Payer: Self-pay | Admitting: Neurology

## 2013-08-01 NOTE — Telephone Encounter (Signed)
Please call and notify the patient that the recent sleep study did not show any significant obstructive sleep apnea generally speaking. She has mild sleep apnea only when sleeping on her back and when in REM sleep, aka dream sleep. For this reason, She is advised to sleep off Her back if possible and to pursue weight loss. Please inform patient that she also had significant leg kicking while asleep. I would like to go over the details of the study during a follow up appointment and where to go from here. Please arrange FU appt at her next convenience. Also, route or fax report to PCP and referring provider, if other than PCP.  Once you have spoken to patient, you can close this encounter.   Thanks,  Huston Foley, MD, PhD [facility])

## 2013-08-07 ENCOUNTER — Encounter: Payer: Self-pay | Admitting: *Deleted

## 2013-08-07 NOTE — Telephone Encounter (Signed)
I called and left a message for the patient about her recent sleep study results.  I informed the patient that her study showed no significant obstructive sleep apnea, but mild apnea when sleeping on her back and in dream sleep. I informed the patient that Dr. Frances Furbish recommends sleeping off her back and to purse weight loss. I informed the patient to callback to the office to schedule a follow appointment to discuss her sleep study results in details. I will mail a copy of the report to the patient and fax a copy to Dr. Vicente Males office.

## 2013-10-03 ENCOUNTER — Encounter (INDEPENDENT_AMBULATORY_CARE_PROVIDER_SITE_OTHER): Payer: Self-pay

## 2013-10-03 ENCOUNTER — Ambulatory Visit (INDEPENDENT_AMBULATORY_CARE_PROVIDER_SITE_OTHER): Payer: BC Managed Care – PPO | Admitting: Neurology

## 2013-10-03 ENCOUNTER — Encounter: Payer: Self-pay | Admitting: Neurology

## 2013-10-03 VITALS — BP 123/74 | HR 75 | Temp 97.7°F | Ht 67.0 in

## 2013-10-03 DIAGNOSIS — G4761 Periodic limb movement disorder: Secondary | ICD-10-CM

## 2013-10-03 DIAGNOSIS — E669 Obesity, unspecified: Secondary | ICD-10-CM

## 2013-10-03 DIAGNOSIS — G4733 Obstructive sleep apnea (adult) (pediatric): Secondary | ICD-10-CM

## 2013-10-03 NOTE — Progress Notes (Signed)
Subjective:    Patient ID: Deborah Perkins is a 53 y.o. female.  HPI    Interim history:   Deborah Perkins is a very friendly 53 year old right-handed woman with an underlying medical history of allergic rhinitis, depression, bladder dysfunction, degenerative joint disease, hyperlipidemia, obesity, hypothyroidism, and varicose veins (s/p laser surgery), who returns for followup consultation of her sleep disorder. She is unaccompanied today. I first met her on 07/11/2013, and which time she complained of non-restorative sleep, snoring as well as restless sleep. She denied frank restless leg symptoms but had been told that at times she jerks or kicks in her sleep. She reported trouble with her right hip at times and often sleeps with a wedge or pillow between both knees. She is a side sleeper. She does wake up several times per night but does not have to go to the bathroom regularly. She denied morning headaches and parasomnias. She reported tooth grinding. She has recently seen her dentist who wanted to hold off on providing her with a bite guard until after her sleep study because she may end up using a oral appliance for OSA. I suggested further evaluation with a sleep study. She had a baseline sleep study on 07/20/2013 and I went over the test results with her in detail today. We also called her in November with the test results. Her sleep efficiency was reduced at 83.7% with a latency to sleep of 55.5 minutes and wake after sleep onset of 7.5 minutes with mild sleep fragmentation noted. She had a mildly elevated arousal index, primarily because of periodic leg movements. She had a normal percentage of stage I sleep, an increased percentage of stage II sleep, and decreased percentage of slow-wave sleep, and a normal percentage of REM sleep with a normal REM latency. Moderate periodic leg movements were noted with 28.36 per hour resulting in 5.9 arousals per hour. EKG was normal. EEG was normal. Mild to moderate  snoring was noted. She had a total AHI of 4 per hour. She had a baseline oxygen saturation of 93%, with a nadir of 85%. She spent 13 minutes and 11 seconds below the saturation of 90%. Time below 88% saturation was 8 minutes and 24 seconds.  Today, she reports having had the flu recently and has a check up tomorrow with Dr. Dagmar Hait. She was treated with a Z pack and steroid shot and oral prednisone. She had fever up to 102. She is trying to lose and has started Nutrisystem. She was able to lose weight without before. She has also essentially stopped taking her Zoloft. She reduced it to every other day for a while and now is off of it for the past 2-3 weeks perhaps. She was worried about it causing her weight gain. Again, she does not endorse any restless legs syndrome type symptoms. Her husband has told her that she kicks in her sleep. She has also been given Nasacort nasal spray.  Her Past Medical History Is Significant For: Past Medical History  Diagnosis Date  . Thyroid disease   . Depression   . Seasonal allergies   . Osteopenia     Her Past Surgical History Is Significant For: Past Surgical History  Procedure Laterality Date  . Foot surgery      multiple surgeries on both feet  . 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 Appendectomy  1990    done when operated for malrotation,  Her Family History Is Significant For: Family History  Problem Relation Age of Onset  . Cancer Mother     breast  . Colon cancer Neg Hx   . Esophageal cancer Neg Hx   . Rectal cancer Neg Hx   . Stomach cancer Neg Hx     Her Social History Is Significant For: History   Social History  . Marital Status: Married    Spouse Name: N/A    Number of Children: N/A  . Years of Education: N/A   Social History Main Topics  . Smoking status: Never Smoker   . Smokeless tobacco: Never Used  . Alcohol Use: 2.4 oz/week    4 Glasses of wine per week      Comment: occasional glass of wine  . Drug Use: No  . Sexual Activity: None   Other Topics Concern  . None   Social History Narrative  . None    Her Allergies Are:  No Known Allergies:   Her Current Medications Are:  Outpatient Encounter Prescriptions as of 10/03/2013  Medication Sig  . ALPRAZolam (XANAX) 0.5 MG tablet 0.5 mg as needed.   Marland Kitchen BIOTIN FORTE PO Take by mouth.  Marland Kitchen buPROPion (WELLBUTRIN XL) 300 MG 24 hr tablet 300 mg every 7 (seven) days.   . Calcium Carbonate-Vitamin D (CALTRATE 600+D PO) Take by mouth daily.    . fluticasone (FLONASE) 50 MCG/ACT nasal spray Place 2 sprays into both nostrils daily.  Marland Kitchen LYSINE PO Take by mouth.  . Multiple Vitamins-Calcium (ONE-A-DAY WOMENS FORMULA PO) Take by mouth daily.    . Multiple Vitamins-Minerals (ZINC PO) Take by mouth.  . sertraline (ZOLOFT) 50 MG tablet   . SYNTHROID 88 MCG tablet 88 mcg daily.   . valACYclovir (VALTREX) 1000 MG tablet 1,000 mg as needed.   . zolpidem (AMBIEN) 10 MG tablet 10 mg as needed.   :  Review of Systems:  Out of a complete 14 point review of systems, all are reviewed and negative with the exception of these symptoms as listed below:   Review of Systems  Constitutional: Negative.   HENT: Negative.   Eyes: Negative.   Respiratory: Negative.   Cardiovascular: Negative.   Gastrointestinal: Negative.   Endocrine: Negative.   Genitourinary: Negative.   Musculoskeletal: Negative.   Skin: Negative.   Allergic/Immunologic: Negative.   Neurological: Negative.   Hematological: Negative.   Psychiatric/Behavioral: Positive for sleep disturbance (snoring).  All other systems reviewed and are negative.    Objective:  Neurologic Exam  Physical Exam Physical Examination:   Filed Vitals:   10/03/13 0829  BP: 123/74  Pulse: 75  Temp: 97.7 F (36.5 C)   General Examination: The patient is a very pleasant 53 y.o. female in no acute distress. She appears well-developed and well-nourished and well  groomed. She is obese.  HEENT: Normocephalic, atraumatic, pupils are equal, round and reactive to light and accommodation. Funduscopic exam is normal with sharp disc margins noted. Extraocular tracking is good without limitation to gaze excursion or nystagmus noted. Normal smooth pursuit is noted. Hearing is grossly intact. Face is symmetric with normal facial animation and normal facial sensation. Speech is clear with no dysarthria noted. There is no hypophonia. There is no lip, neck/head, jaw or voice tremor. Neck is supple with full range of passive and active motion. There are no carotid bruits on auscultation. Oropharynx exam reveals: mild mouth dryness, good dental hygiene and mild airway crowding, due to a narrow airway entry and thicker  soft palate. There is mild pharyngeal erythema. Mallampati is class II. Tongue protrudes centrally and palate elevates symmetrically. Tonsils are small.  Chest: Clear to auscultation without wheezing, rhonchi or crackles noted.  Heart: S1+S2+0, regular and normal without murmurs, rubs or gallops noted.   Abdomen: Soft, non-tender and non-distended with normal bowel sounds appreciated on auscultation.  Extremities: There is no pitting edema in the distal lower extremities bilaterally. Pedal pulses are intact.  Skin: Warm and dry without trophic changes noted. There are no varicose veins; she has spider veins.  Musculoskeletal: exam reveals no obvious joint deformities, tenderness or joint swelling or erythema.   Neurologically:  Mental status: The patient is awake, alert and oriented in all 4 spheres. Her memory, attention, language and knowledge are appropriate. There is no aphasia, agnosia, apraxia or anomia. Speech is clear with normal prosody and enunciation. Thought process is linear. Mood is congruent and affect is normal.  Cranial nerves are as described above under HEENT exam. In addition, shoulder shrug is normal with equal shoulder height  noted. Motor exam: Normal bulk, strength and tone is noted. There is no drift, tremor or rebound. Romberg is negative. Reflexes are 2+ throughout. Toes are downgoing bilaterally. Fine motor skills are intact with normal finger taps, normal hand movements, normal rapid alternating patting, normal foot taps and normal foot agility.  Cerebellar testing shows no dysmetria or intention tremor on finger to nose testing. Heel to shin is unremarkable bilaterally. There is no truncal or gait ataxia.  Sensory exam is intact to light touch, pinprick, vibration, temperature sense and proprioception in the upper and lower extremities.  Gait, station and balance are unremarkable. No veering to one side is noted. No leaning to one side is noted. Posture is age-appropriate and stance is narrow based. No problems turning are noted. She turns en bloc. Tandem walk is unremarkable. Intact toe and heel stance is noted.               Assessment and Plan:   In summary, Nataliee Shurtz Tschetter is a very pleasant 53 year old female with a history of allergic rhinitis, depression, bladder dysfunction, degenerative joint disease, hyperlipidemia, obesity, and hypothyroidism, historywho underwent recent sleep study evaluation. We talked at length about her study findings and her symptoms regarding sleep. She has overall a normal AHI below 5 per hour, but evidence of mild sleep apnea when she sleeps on her back and went in REM sleep. For this, I think she will benefit from sleeping off her back and striving for weight loss. Alternatively, since she also reported tooth grinding and at times loud snoring, she can consider an oral appliance. She has talked about this with her dentist before. She is encouraged to bring this up with her dentist and also take her sleep study results with her at the time. She denies any restless leg symptoms but has evidence of periodic leg movements in sleep with arousals. But I do think that now she is off of Zoloft she  may notice an improvement. She may not kick as frequently, and is advised to have her husband watch this a little closer. I also advised her that now that she's off of Zoloft she may have recurrence of depressive symptoms with time. Sometimes it takes 4-6 weeks to notice a change in mood after stopping an antidepressant. She is encouraged to bring this up tomorrow when she sees Dr. Dagmar Hait. We talked a little bit about her suboptimal baseline oxygen saturation at 93% and  her dropped to 85%. She may have some form of reactive airways disease given that she had significant cough and needed to be treated with a Z-Pak and steroids having had the flu. She also was encouraged through her primary care physician's office to pursue formal PFTs. I think she should consider this once she is back to baseline. Her oxygen saturation may also have something to do with obesity hypoventilation. Again, she is encouraged to strive for weight loss. She understands this and at this juncture she and I can probably play it by ear. She is advised to make her appointment with her dentist and continue to work on her weight loss, which I believe will help with her nighttime oxygenation, snoring, and mild sleep apnea.  I answered all her questions today and the patient was in agreement. I would like to see her back as needed.

## 2013-10-03 NOTE — Patient Instructions (Signed)
I will see you back as needed.

## 2014-02-04 ENCOUNTER — Other Ambulatory Visit (HOSPITAL_COMMUNITY): Payer: Self-pay | Admitting: Internal Medicine

## 2014-02-04 DIAGNOSIS — Z1231 Encounter for screening mammogram for malignant neoplasm of breast: Secondary | ICD-10-CM

## 2014-02-06 ENCOUNTER — Ambulatory Visit (HOSPITAL_COMMUNITY)
Admission: RE | Admit: 2014-02-06 | Discharge: 2014-02-06 | Disposition: A | Discharge status: Home / Self Care | Payer: BC Managed Care – PPO | Source: Ambulatory Visit | Attending: Internal Medicine | Admitting: Internal Medicine

## 2014-02-06 DIAGNOSIS — Z1231 Encounter for screening mammogram for malignant neoplasm of breast: Secondary | ICD-10-CM | POA: Insufficient documentation

## 2014-05-14 ENCOUNTER — Encounter: Payer: Self-pay | Admitting: Obstetrics & Gynecology

## 2014-05-14 ENCOUNTER — Ambulatory Visit (INDEPENDENT_AMBULATORY_CARE_PROVIDER_SITE_OTHER): Payer: BC Managed Care – PPO | Admitting: Obstetrics & Gynecology

## 2014-05-14 VITALS — BP 106/62 | HR 68 | Resp 20 | Ht 66.5 in | Wt 226.8 lb

## 2014-05-14 DIAGNOSIS — Z01419 Encounter for gynecological examination (general) (routine) without abnormal findings: Secondary | ICD-10-CM

## 2014-05-14 DIAGNOSIS — Z124 Encounter for screening for malignant neoplasm of cervix: Secondary | ICD-10-CM

## 2014-05-14 MED ORDER — ESOMEPRAZOLE MAGNESIUM 40 MG PO PACK: 40.0000 mg | PACK | Freq: Every day | ORAL | Status: DC

## 2014-05-14 NOTE — Progress Notes (Signed)
53 y.o. G2P2 MarriedCaucasianF here for annual exam.  Doing well.  No vaginal bleeding.   PCP--Dr. Dagmar Hait.  Will see him next week.  Having blood work done a couple of days before appt.   Pt is Deborah Perkins daughter.  Patient's last menstrual period was 09/06/2006.          Sexually active: Yes.    The current method of family planning is post menopausal status.    Exercising: Yes.    walking Smoker:  no  Health Maintenance: Pap:  05/24/11 WNL History of abnormal Pap:  yes MMG:  02/06/14-normal, 3D Colonoscopy:  10/13-repeat in 10 years Dr Olevia Perches BMD:   9/12, -1.4/0.3  frax 3.1%/0.1% TDaP:  2014 Screening Labs: PCP, Hb today: PCP, Urine today: PCP   reports that she has never smoked. She has never used smokeless tobacco. She reports that she drinks alcohol. She reports that she does not use illicit drugs.  Past Medical History  Diagnosis Date  . Thyroid disease   . Depression   . Seasonal allergies   . Osteopenia     Past Surgical History  Procedure Laterality Date  . Foot surgery      multiple surgeries on both feet  . 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 Appendectomy  1990    done when operated for malrotation,     Current Outpatient Prescriptions  Medication Sig Dispense Refill  . ALPRAZolam (XANAX) 0.5 MG tablet 0.5 mg as needed.       Marland Kitchen BIOTIN FORTE PO Take by mouth.      Marland Kitchen buPROPion (WELLBUTRIN XL) 300 MG 24 hr tablet 300 mg every 7 (seven) days.       . Calcium Carbonate-Vitamin D (CALTRATE 600+D PO) Take by mouth daily.        . fluticasone (FLONASE) 50 MCG/ACT nasal spray Place 2 sprays into both nostrils daily.      Marland Kitchen LYSINE PO Take by mouth.      . Multiple Vitamins-Calcium (ONE-A-DAY WOMENS FORMULA PO) Take by mouth daily.        . Multiple Vitamins-Minerals (ZINC PO) Take by mouth.      . SYNTHROID 88 MCG tablet 88 mcg daily.       . valACYclovir (VALTREX) 1000 MG tablet 1,000 mg as needed.       .  zolpidem (AMBIEN) 10 MG tablet 10 mg as needed.        No current facility-administered medications for this visit.    Family History  Problem Relation Age of Onset  . Cancer Mother     breast  . Colon cancer Neg Hx   . Esophageal cancer Neg Hx   . Rectal cancer Neg Hx   . Stomach cancer Neg Hx     ROS:  Pertinent items are noted in HPI.  Otherwise, a comprehensive ROS was negative.  Exam:   BP 106/62  Pulse 68  Resp 20  Ht 5' 6.5" (1.689 m)  Wt 226 lb 12.8 oz (102.876 kg)  BMI 36.06 kg/m2  LMP 09/06/2006  Weight change: +35#   Height: 5' 6.5" (168.9 cm)  Ht Readings from Last 3 Encounters:  05/14/14 5' 6.5" (1.689 m)  10/03/13 5\' 7"  (1.702 m)  07/11/13 5\' 7"  (1.702 m)    General appearance: alert, cooperative and appears stated age Head: Normocephalic, without obvious abnormality, atraumatic Neck: no adenopathy, supple, symmetrical, trachea midline and thyroid normal to  inspection and palpation Lungs: clear to auscultation bilaterally Breasts: normal appearance, no masses or tenderness Heart: regular rate and rhythm Abdomen: soft, non-tender; bowel sounds normal; no masses,  no organomegaly Extremities: extremities normal, atraumatic, no cyanosis or edema Skin: Skin color, texture, turgor normal. No rashes or lesions Lymph nodes: Cervical, supraclavicular, and axillary nodes normal. No abnormal inguinal nodes palpated Neurologic: Grossly normal   Pelvic: External genitalia:  no lesions              Urethra:  normal appearing urethra with no masses, tenderness or lesions              Bartholins and Skenes: normal                 Vagina: normal appearing vagina with normal color and discharge, no lesions              Cervix: no lesions              Pap taken: Yes.   Bimanual Exam:  Uterus:  normal size, contour, position, consistency, mobility, non-tender              Adnexa: normal adnexa and no mass, fullness, tenderness               Rectovaginal: Confirms                Anus:  normal sphincter tone, no lesions  A:  Well Woman with normal exam PMP, no HRT Hypothyroidism  P:   Mammogram yearly.  3D done.  D/W pt pros/cons of 3D MMG pap smear with HR HPV obtained today Labs/appt with Dr. Dagmar Hait  return annually or prn  An After Visit Summary was printed and given to the patient.

## 2014-05-16 LAB — IPS PAP TEST WITH HPV

## 2014-07-08 ENCOUNTER — Encounter: Payer: Self-pay | Admitting: Obstetrics & Gynecology

## 2015-07-01 ENCOUNTER — Ambulatory Visit: Payer: BC Managed Care – PPO | Admitting: Obstetrics & Gynecology

## 2015-07-01 ENCOUNTER — Telehealth: Payer: Self-pay | Admitting: Obstetrics & Gynecology

## 2015-07-01 NOTE — Telephone Encounter (Signed)
Patient was more than 15 minutes late for appointment and will call back to reschedule.

## 2017-11-08 ENCOUNTER — Encounter: Payer: Self-pay | Admitting: Obstetrics & Gynecology

## 2017-11-30 ENCOUNTER — Other Ambulatory Visit: Payer: Self-pay | Admitting: Obstetrics & Gynecology

## 2017-11-30 ENCOUNTER — Other Ambulatory Visit: Payer: Self-pay

## 2017-11-30 ENCOUNTER — Ambulatory Visit (INDEPENDENT_AMBULATORY_CARE_PROVIDER_SITE_OTHER): Payer: No Typology Code available for payment source | Admitting: Obstetrics & Gynecology

## 2017-11-30 ENCOUNTER — Encounter: Payer: Self-pay | Admitting: Obstetrics & Gynecology

## 2017-11-30 ENCOUNTER — Other Ambulatory Visit (HOSPITAL_COMMUNITY)
Admission: RE | Admit: 2017-11-30 | Discharge: 2017-11-30 | Disposition: A | Discharge status: Home / Self Care | Payer: No Typology Code available for payment source | Source: Ambulatory Visit | Attending: Obstetrics & Gynecology | Admitting: Obstetrics & Gynecology

## 2017-11-30 VITALS — BP 122/74 | HR 76 | Resp 16 | Ht 66.75 in | Wt 230.0 lb

## 2017-11-30 DIAGNOSIS — Z01419 Encounter for gynecological examination (general) (routine) without abnormal findings: Secondary | ICD-10-CM

## 2017-11-30 DIAGNOSIS — Z1231 Encounter for screening mammogram for malignant neoplasm of breast: Secondary | ICD-10-CM

## 2017-11-30 DIAGNOSIS — Z124 Encounter for screening for malignant neoplasm of cervix: Secondary | ICD-10-CM | POA: Insufficient documentation

## 2017-11-30 NOTE — Patient Instructions (Signed)
[facility].  [address].  [facility].  [phone]

## 2017-11-30 NOTE — Progress Notes (Signed)
57 y.o. [id number] MarriedCaucasianF here for annual exam.  Former patient.  Hasn't been here in 3 1/2 years.    Has moved away from [place] but kept home in [place].  Living in [place] now.      Husband has MS and is now on disability.    PCP:  Dr. Dagmar Hait.  Last appt was 8/18.  Blood work was normal.    Patient's last menstrual period was 09/06/2006.          Sexually active: Yes.    The current method of family planning is post menopausal status.    Exercising: No.   Smoker:  no  Health Maintenance: Pap:  05/14/14 Neg. HR HPV:neg  History of abnormal Pap:  Yes, remote hx  MMG:  02/06/14 BIRADS1:neg  Colonoscopy:  06/14/12 Normal. F/u 10 years  BMD:   2012 TDaP:  Current with PCP Pneumonia vaccine(s):  n/a Shingrix:   No Hep C testing: unsure Screening Labs: PCP   reports that she has never smoked. She has never used smokeless tobacco. She reports that she drinks about 4.2 oz of alcohol per week. She reports that she does not use drugs.  Past Medical History:  Diagnosis Date  . Depression   . Osteopenia   . Seasonal allergies   . Thyroid disease     Past Surgical History:  Procedure Laterality Date  . APPENDECTOMY  1990   done when operated for malrotation,   . FOOT SURGERY     multiple surgeries on both feet  . 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 ALPRAZolam (XANAX) 0.5 MG tablet 0.5 mg as needed.     Marland Kitchen BIOTIN FORTE PO Take by mouth.    Marland Kitchen buPROPion (WELLBUTRIN XL) 300 MG 24 hr tablet 300 mg every 7 (seven) days.     . Calcium Carbonate-Vitamin D (CALTRATE 600+D PO) Take by mouth daily.      . clotrimazole (MYCELEX) 10 MG troche daily as needed.  1  . diclofenac sodium (VOLTAREN) 1 % GEL daily as needed.  2  . fluticasone (FLONASE) 50 MCG/ACT nasal spray Place 2 sprays into both nostrils daily.    Marland Kitchen LYSINE PO Take by mouth.    . meloxicam  (MOBIC) 15 MG tablet Take 15 mg by mouth daily. with food  2  . Multiple Vitamins-Calcium (ONE-A-DAY WOMENS FORMULA PO) Take by mouth daily.      . pantoprazole (PROTONIX) 40 MG tablet Take 40 mg by mouth daily.  1  . sertraline (ZOLOFT) 50 MG tablet Take 1 tablet by mouth at bedtime.  0  . SYNTHROID 88 MCG tablet 88 mcg daily.     . valACYclovir (VALTREX) 1000 MG tablet 1,000 mg as needed.     . zolpidem (AMBIEN) 10 MG tablet 10 mg as needed.      No current facility-administered medications for this visit.     Family History  Problem Relation Age of Onset  . Cancer Mother        breast  . Colon cancer Neg Hx   . Esophageal cancer Neg Hx   . Rectal cancer Neg Hx   . Stomach cancer Neg Hx     Review of Systems  All other systems reviewed and are negative.   Exam:   BP 122/74 (BP Location: Right Arm, Patient Position: Sitting, Cuff Size: Large)   Pulse  76   Resp 16   Ht 5' 6.75" (1.695 m)   Wt 230 lb (104.3 kg)   LMP 09/06/2006   BMI 36.29 kg/m    Height: 5' 6.75" (169.5 cm)  Ht Readings from Last 3 Encounters:  11/30/17 5' 6.75" (1.695 m)  05/14/14 5' 6.5" (1.689 m)  10/03/13 5\' 7"  (1.702 m)    General appearance: alert, cooperative and appears stated age Head: Normocephalic, without obvious abnormality, atraumatic Neck: no adenopathy, supple, symmetrical, trachea midline and thyroid normal to inspection and palpation Lungs: clear to auscultation bilaterally Breasts: normal appearance, no masses or tenderness Heart: regular rate and rhythm Abdomen: soft, non-tender; bowel sounds normal; no masses,  no organomegaly Extremities: extremities normal, atraumatic, no cyanosis or edema Skin: Skin color, texture, turgor normal. No rashes or lesions Lymph nodes: Cervical, supraclavicular, and axillary nodes normal. No abnormal inguinal nodes palpated Neurologic: Grossly normal  Pelvic: External genitalia:  no lesions              Urethra:  normal appearing urethra with no  masses, tenderness or lesions              Bartholins and Skenes: normal                 Vagina: normal appearing vagina with normal color and discharge, no lesions              Cervix: no lesions              Pap taken: Yes.   Bimanual Exam:  Uterus:  normal size, contour, position, consistency, mobility, non-tender              Adnexa: normal adnexa and no mass, fullness, tenderness               Rectovaginal: Confirms               Anus:  normal sphincter tone, no lesions  Chaperone was present for exam.  A:  Well Woman with normal exam PMP, no HRT Hypothyroidism   P:   Mammogram guidelines reviewed.  Pt aware this is due.  Will try to schedule for her while she is in town. pap smear and HR HPV obtained today Lab work UTD with Dr. Dagmar Hait Information regarding Shingrix vaccine given today Colonoscopy UTD return annually or prn

## 2017-12-01 LAB — CYTOLOGY - PAP
Adequacy: ABSENT
Diagnosis: NEGATIVE
HPV (WINDOPATH): NOT DETECTED

## 2017-12-21 ENCOUNTER — Ambulatory Visit
Admission: RE | Admit: 2017-12-21 | Discharge: 2017-12-21 | Disposition: A | Discharge status: Home / Self Care | Payer: No Typology Code available for payment source | Source: Ambulatory Visit | Attending: Obstetrics & Gynecology | Admitting: Obstetrics & Gynecology

## 2017-12-21 DIAGNOSIS — Z1231 Encounter for screening mammogram for malignant neoplasm of breast: Secondary | ICD-10-CM

## 2019-02-13 ENCOUNTER — Ambulatory Visit: Payer: No Typology Code available for payment source | Admitting: Obstetrics & Gynecology

## 2019-02-28 ENCOUNTER — Other Ambulatory Visit: Payer: Self-pay | Admitting: Obstetrics & Gynecology

## 2019-02-28 DIAGNOSIS — Z9289 Personal history of other medical treatment: Secondary | ICD-10-CM

## 2019-03-01 ENCOUNTER — Ambulatory Visit
Admission: RE | Admit: 2019-03-01 | Discharge: 2019-03-01 | Disposition: A | Discharge status: Home / Self Care | Payer: 59 | Source: Ambulatory Visit | Attending: Obstetrics & Gynecology | Admitting: Obstetrics & Gynecology

## 2019-03-01 ENCOUNTER — Other Ambulatory Visit: Payer: Self-pay

## 2019-03-01 DIAGNOSIS — Z9289 Personal history of other medical treatment: Secondary | ICD-10-CM

## 2019-10-04 ENCOUNTER — Other Ambulatory Visit: Payer: Self-pay

## 2019-10-08 ENCOUNTER — Ambulatory Visit (INDEPENDENT_AMBULATORY_CARE_PROVIDER_SITE_OTHER): Payer: 59 | Admitting: Obstetrics & Gynecology

## 2019-10-08 ENCOUNTER — Other Ambulatory Visit: Payer: Self-pay

## 2019-10-08 ENCOUNTER — Encounter: Payer: Self-pay | Admitting: Obstetrics & Gynecology

## 2019-10-08 VITALS — BP 125/75 | HR 75 | Temp 97.2°F | Ht 66.0 in | Wt 228.6 lb

## 2019-10-08 DIAGNOSIS — Z124 Encounter for screening for malignant neoplasm of cervix: Secondary | ICD-10-CM

## 2019-10-08 DIAGNOSIS — Z01419 Encounter for gynecological examination (general) (routine) without abnormal findings: Secondary | ICD-10-CM

## 2019-10-08 NOTE — Progress Notes (Signed)
59 y.o. G41P2002 Married White or Caucasian female here for annual exam.  Doing well.  Has been tested three times for Covid this past year.  Hasn't been sick.  Son in Sports coach is the new Pensions consultant for [employer] starting March 1.    Denies vaginal bleeding.     About a week ago (on 10/02/2019), had episode of abdominal pain, nausea/vomiting/diarrhea.  She ran a fever as well.  She was at [facility] at the time.  Went to the ER.  CT showed colon inflammation.  Fever went away quickly.  Emesis passed after about a day.  Diarrhea and pain resolved 1/29.  Wonders if any of this was gyn related.    PCP:  Dr. Dagmar Hait.  Had virtual visit.  Blood work done in the fall as well.  Patient's last menstrual period was 09/06/2006.          Sexually active: Yes.    The current method of family planning is post menopausal status.    Exercising: No.  The patient does not participate in regular exercise at present. Smoker:  no  Health Maintenance: Pap:  11/30/2017 Neg. HR HPV:neg  History of abnormal Pap:  Yes, remote hx MMG:  03/01/19 BIRADS 1 negative/density b Colonoscopy:  06/14/12 Normal. F/u 10 years BMD:   2012.  D/w pt repeating in 2 years3 TDaP:  Up todate, with Dr Dagmar Hait Pneumonia vaccine(s):  no Shingrix: completed Hep C testing: not sure Screening Labs: PCP   reports that she has never smoked. She has never used smokeless tobacco. She reports current alcohol use of about 7.0 standard drinks of alcohol per week. She reports that she does not use drugs.  Past Medical History:  Diagnosis Date  . Depression   . Osteopenia   . Seasonal allergies   . Thyroid disease     Past Surgical History:  Procedure Laterality Date  . APPENDECTOMY  1990   done when operated for malrotation,   . FOOT SURGERY     multiple surgeries on both feet  . 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 ALPRAZolam  (XANAX) 0.5 MG tablet 0.5 mg as needed.     Marland Kitchen BIOTIN FORTE PO Take by mouth.    Marland Kitchen buPROPion (WELLBUTRIN XL) 300 MG 24 hr tablet 300 mg every 7 (seven) days.     . Calcium Carbonate-Vitamin D (CALTRATE 600+D PO) Take by mouth daily.      . clotrimazole (MYCELEX) 10 MG troche daily as needed.  1  . diclofenac sodium (VOLTAREN) 1 % GEL daily as needed.  2  . fluticasone (FLONASE) 50 MCG/ACT nasal spray Place 2 sprays into both nostrils daily.    Marland Kitchen LYSINE PO Take by mouth.    . meloxicam (MOBIC) 15 MG tablet Take 15 mg by mouth daily. with food  2  . Multiple Vitamins-Calcium (ONE-A-DAY WOMENS FORMULA PO) Take by mouth daily.      . pantoprazole (PROTONIX) 40 MG tablet Take 40 mg by mouth daily.  1  . sertraline (ZOLOFT) 50 MG tablet Take 1 tablet by mouth at bedtime.  0  . SYNTHROID 88 MCG tablet 88 mcg daily.     . valACYclovir (VALTREX) 1000 MG tablet 1,000 mg as needed.     . zolpidem (AMBIEN) 10 MG tablet 10 mg as needed.      No current facility-administered medications for this  visit.    Family History  Problem Relation Age of Onset  . Cancer Mother        breast  . Breast cancer Mother 54  . Colon cancer Neg Hx   . Esophageal cancer Neg Hx   . Rectal cancer Neg Hx   . Stomach cancer Neg Hx     Review of Systems  Gastrointestinal: Positive for abdominal pain.  All other systems reviewed and are negative.   Exam:   Vitals:   10/08/19 1246  BP: 125/75  Pulse: 75  Temp: (!) 97.2 F (36.2 C)    General appearance: alert, cooperative and appears stated age Head: Normocephalic, without obvious abnormality, atraumatic Neck: no adenopathy, supple, symmetrical, trachea midline and thyroid normal to inspection and palpation Lungs: clear to auscultation bilaterally Breasts: normal appearance, no masses or tenderness Heart: regular rate and rhythm Abdomen: soft, non-tender; bowel sounds normal; no masses,  no organomegaly Extremities: extremities normal, atraumatic, no  cyanosis or edema Skin: Skin color, texture, turgor normal. No rashes or lesions Lymph nodes: Cervical, supraclavicular, and axillary nodes normal. No abnormal inguinal nodes palpated Neurologic: Grossly normal   Pelvic: External genitalia:  no lesions              Urethra:  normal appearing urethra with no masses, tenderness or lesions              Bartholins and Skenes: normal                 Vagina: normal appearing vagina with normal color and discharge, no lesions              Cervix: no lesions              Pap taken: No. Bimanual Exam:  Uterus:  normal size, contour, position, consistency, mobility, non-tender              Adnexa: normal adnexa and no mass, fullness, tenderness               Rectovaginal: Confirms               Anus:  normal sphincter tone, no lesions  Chaperone, Terence Lux, CMA, was present for exam.  A:  Well Woman with normal exam PMP, no HRT Hypothyroidism Intestinal malrotation Family hx of breast cancer.  Tyrer Cusick model assessment today is 10.4% lifetime risk of breast cancer  P:   Mammogram guidelines reviewed.  Abbreviated/limited brreast MRI discussed.  Information given pap smear neg with neg HR HPV 2019.  Not indicated today.  Guidelines reviewed. Release for CT signed today from ER visit at [facility] general Lab work/vaccines done with Dr. Dagmar Hait Return annually or prn

## 2019-10-16 ENCOUNTER — Telehealth: Payer: Self-pay

## 2019-10-16 NOTE — Telephone Encounter (Signed)
Tried calling patient with CT results as written by Dr. Sabra Heck. No answer, left a detailed message on patient's voicemail with results noting enterocolitis but no GYN abnormalities and does not need a f/u. Okay per DPR to leave a detailed message on patient's voicemail at [phone]. Advised patient if she has any questions to give me a call back at [phone]. Closing encounter.

## 2019-10-23 ENCOUNTER — Encounter: Payer: Self-pay | Admitting: Emergency Medicine

## 2019-10-26 ENCOUNTER — Ambulatory Visit: Payer: 59 | Admitting: Gastroenterology

## 2019-11-27 ENCOUNTER — Other Ambulatory Visit: Payer: Self-pay

## 2019-11-27 ENCOUNTER — Encounter: Payer: Self-pay | Admitting: Gastroenterology

## 2019-11-27 ENCOUNTER — Ambulatory Visit (INDEPENDENT_AMBULATORY_CARE_PROVIDER_SITE_OTHER): Payer: 59 | Admitting: Gastroenterology

## 2019-11-27 VITALS — BP 138/82 | HR 90 | Temp 98.4°F | Ht 67.0 in | Wt 228.1 lb

## 2019-11-27 DIAGNOSIS — R112 Nausea with vomiting, unspecified: Secondary | ICD-10-CM

## 2019-11-27 DIAGNOSIS — R1032 Left lower quadrant pain: Secondary | ICD-10-CM | POA: Diagnosis not present

## 2019-11-27 DIAGNOSIS — K529 Noninfective gastroenteritis and colitis, unspecified: Secondary | ICD-10-CM | POA: Diagnosis not present

## 2019-11-27 MED ORDER — NA SULFATE-K SULFATE-MG SULF 17.5-3.13-1.6 GM/177ML PO SOLN: 1.0000 | ORAL | 0 refills | Status: AC

## 2019-11-27 NOTE — Patient Instructions (Signed)
You have been scheduled for an endoscopy and colonoscopy. Please follow the written instructions given to you at your visit today. Please pick up your prep supplies at the pharmacy within the next 1-3 days. If you use inhalers (even only as needed), please bring them with you on the day of your procedure.  Metamucil or any psyllium fiber, 1 tablespoon once or twice daily can be used to keep bowels regular if needed.  Continue daily probiotic such as Electronics engineer.   I value your feedback and thank you for entrusting [place] with your care. If you get a [facility] patient survey, I would appreciate you taking the time to let us know about your experience today. Thank you!   Due to recent changes in healthcare laws, you may see the results of your imaging and laboratory studies on MyChart before your provider has had a chance to review them.  We understand that in some cases there may be results that are confusing or concerning to you. Not all laboratory results come back in the same time frame and the provider may be waiting for multiple results in order to interpret others.  Please give [place] 48 hours in order for your provider to thoroughly review all the results before contacting the office for clarification of your results.

## 2019-11-27 NOTE — Progress Notes (Signed)
Referring Provider: Prince Solian, MD Primary Care Physician:  Prince Solian, MD  Reason for Consultation:  Enterocolitis   IMPRESSION:  Intermittent nausea Intermittent LLQ pain Recent Enterocolitis on CT Diverticulosis Fatty liver on CT Normal screening colonoscopy 2013 GERD controlled on pantoprazole  Recent enterocolitis with persistent nausea and LLQ pain since then.  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 Differential also includes esophagitis, gastritis, H pylori, SCAD, and symptomatic diverticulosis.  Will obtain prior CT scan for comparison to recent findings.   PLAN: Add a daily stool bulking agent such a psyllium Continue daily probiotic - Align recommended Continue daily pantoprazole for GERD Consider trial of Bentyl if symptoms do not improve with psyllium Obtain CT scan from [facility] from 2019 or 2020 (not 2021 CT results) EGD and colonoscopy  Please see the "Patient Instructions" section for addition details about the plan.  HPI: Deborah Perkins is a 59 y.o. female referred by Dr. Dagmar Hait.  The history is obtained to the patient, review of her electronic health record, and 17 pages records provided by Dr. Dagmar Hait. Lives have the year in [place], [place] and half the year in [place].  She has hyperlipidemia, chronic bladder dysfunction, obesity, hypothyroidism, mood disorder, restless leg syndrome, obstructive sleep apnea not requiring CPAP, and malrotation of the small bowel.  History of GERD well-controlled on PPI.  Husband has MS.  Received her first Covid vaccine last month.  Retired Optometrist.   Was seen in in the ED at [facility] in [place] on October 02, 2019 for diarrhea, abdominal pain, and fever.  CBC, CMP, and urinalysis were normal.  A CT of the abdomen and pelvis with IV contrast found mild wall thickening and enhancement involving the distal small bowel and colon consistent with enterocolitis.  Also identified was mild edema in the  small bowel mesentery, mild diverticulosis, cholelithiasis, fatty liver, and a small hiatal hernia.  Ongoing long-term nausea and intermittent LLQ pain that radiates to the umbilicus. Occasional diarrhea.  Drinks Sprite to control the nausea. Pain is intermittent over 5 hours periods at a time. No change in symptoms with defecation, movement, or eating.  No blood or mucous in the stool. Good appetite. Weight stable.   Denies a precipitating event, trauma, close contacts with similar symptoms, changes in diet, recent travel or antibiotic use. No one else at home with similar symptoms.   Evaluation by GYN and was negative.  No improvement with Florastor. No NSAIDs.   She had a normal high quality screening colonoscopy with Dr. Olevia Perches 06/14/2012.  No other history of endoscopic evaluation.  She had a negative IFOB 05/09/2018.  Hemoglobin 14.4,  MCV 90, RDW 12.2, platelets 295  Labs from 10/09/2019 show an iron of 77, iron saturation 28%, hemoglobin 14.4  No known family history of colon cancer or polyps. No family history of uterine/endometrial cancer, pancreatic cancer or gastric/stomach cancer.   Past Medical History:  Diagnosis Date  . Depression   . DJD (degenerative joint disease)   . Fibrocystic breast disease   . Hyperlipidemia   . Obesity   . OSA (obstructive sleep apnea)   . Osteopenia   . Restless leg syndrome   . Seasonal allergies   . Thyroid disease   . Varicose veins of both legs with edema     Past Surgical History:  Procedure Laterality Date  . APPENDECTOMY  1990   done when operated for malrotation,   . FOOT SURGERY     multiple surgeries on both  feet  . 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 ALPRAZolam (XANAX) 0.5 MG tablet 0.5 mg as needed.     Marland Kitchen BIOTIN FORTE PO Take by mouth.    Marland Kitchen buPROPion (WELLBUTRIN XL) 300 MG 24 hr tablet 300 mg every  7 (seven) days.     . diclofenac sodium (VOLTAREN) 1 % GEL daily as needed.  2  . fluticasone (FLONASE) 50 MCG/ACT nasal spray Place 2 sprays into both nostrils daily.    Marland Kitchen levothyroxine (SYNTHROID) 88 MCG tablet Take by mouth.    . LYSINE PO Take by mouth.    . Multiple Vitamins-Calcium (ONE-A-DAY WOMENS FORMULA PO) Take by mouth daily.      . pantoprazole (PROTONIX) 40 MG tablet Take 40 mg by mouth daily.  1  . sertraline (ZOLOFT) 50 MG tablet Take 1 tablet by mouth at bedtime.  0  . valACYclovir (VALTREX) 1000 MG tablet 1,000 mg as needed.     . zolpidem (AMBIEN) 10 MG tablet 10 mg as needed.      No current facility-administered medications for this visit.    Allergies as of 11/27/2019  . (No Known Allergies)    Family History  Problem Relation Age of Onset  . Breast cancer Mother 32  . Stroke Paternal Uncle   . Colon cancer Neg Hx   . Esophageal cancer Neg Hx   . Rectal cancer Neg Hx   . Stomach cancer Neg Hx     Social History   Socioeconomic History  . Marital status: Married    Spouse name: Not on file  . Number of children: Not on file  . Years of education: Not on file  . Highest education level: Not on file  Occupational History  . Occupation: homemaker  Tobacco Use  . Smoking status: Never Smoker  . Smokeless tobacco: Never Used  Substance and Sexual Activity  . Alcohol use: Yes    Alcohol/week: 3.0 standard drinks    Types: 3 Standard drinks or equivalent per week    Comment: glass of red wine  . Drug use: No  . Sexual activity: Yes    Partners: Male    Birth control/protection: Post-menopausal  Other Topics Concern  . Not on file  Social History Narrative  . Not on file   Social Determinants of Health   Financial Resource Strain:   . Difficulty of Paying Living Expenses:   Food Insecurity:   . Worried About Charity fundraiser in the Last Year:   . Arboriculturist in the Last Year:   Transportation Needs:   . Film/video editor  (Medical):   Marland Kitchen Lack of Transportation (Non-Medical):   Physical Activity:   . Days of Exercise per Week:   . Minutes of Exercise per Session:   Stress:   . Feeling of Stress :   Social Connections:   . Frequency of Communication with Friends and Family:   . Frequency of Social Gatherings with Friends and Family:   . Attends Religious Services:   . Active Member of Clubs or Organizations:   . Attends Archivist Meetings:   Marland Kitchen Marital Status:   Intimate Partner Violence:   . Fear of Current or Ex-Partner:   . Emotionally Abused:   Marland Kitchen Physically Abused:   . Sexually Abused:     Review of Systems: 12 system  ROS is negative except as noted above except for arthritis and hearing problems.   Physical Exam: General:   Alert,  well-nourished, pleasant and cooperative in NAD. Wearing hearing aides.  Head:  Normocephalic and atraumatic. Eyes:  Sclera clear, no icterus.   Conjunctiva pink. Ears:  Normal auditory acuity. Nose:  No deformity, discharge,  or lesions. Mouth:  No deformity or lesions.   Neck:  Supple; no masses or thyromegaly. Lungs:  Clear throughout to auscultation.   No wheezes. Heart:  Regular rate and rhythm; no murmurs. Abdomen:  Soft, nontender, nondistended, normal bowel sounds, no rebound or guarding.  I am unable to reproduce her abdominal pain.  No hepatosplenomegaly.   Rectal:  Deferred  Msk:  Symmetrical. No boney deformities LAD: No inguinal or umbilical LAD Extremities:  No clubbing or edema. Neurologic:  Alert and  oriented x4;  grossly nonfocal Skin:  No rash or bruise.  Psych:  Alert and cooperative. Normal mood and affect.      Oleda Borski L. Tarri Glenn, MD, MPH 11/27/2019, 9:46 AM

## 2019-11-28 ENCOUNTER — Encounter: Payer: Self-pay | Admitting: Gastroenterology

## 2020-01-03 ENCOUNTER — Encounter: Payer: Self-pay | Admitting: Gastroenterology

## 2020-01-03 ENCOUNTER — Ambulatory Visit (AMBULATORY_SURGERY_CENTER): Payer: 59 | Admitting: Gastroenterology

## 2020-01-03 ENCOUNTER — Other Ambulatory Visit: Payer: Self-pay

## 2020-01-03 VITALS — BP 122/72 | HR 71 | Temp 97.7°F | Resp 10 | Ht 67.0 in | Wt 228.0 lb

## 2020-01-03 DIAGNOSIS — K449 Diaphragmatic hernia without obstruction or gangrene: Secondary | ICD-10-CM

## 2020-01-03 DIAGNOSIS — R1032 Left lower quadrant pain: Secondary | ICD-10-CM

## 2020-01-03 DIAGNOSIS — R112 Nausea with vomiting, unspecified: Secondary | ICD-10-CM

## 2020-01-03 DIAGNOSIS — K2950 Unspecified chronic gastritis without bleeding: Secondary | ICD-10-CM | POA: Diagnosis not present

## 2020-01-03 DIAGNOSIS — K648 Other hemorrhoids: Secondary | ICD-10-CM | POA: Diagnosis not present

## 2020-01-03 DIAGNOSIS — K3189 Other diseases of stomach and duodenum: Secondary | ICD-10-CM

## 2020-01-03 DIAGNOSIS — K228 Other specified diseases of esophagus: Secondary | ICD-10-CM | POA: Diagnosis not present

## 2020-01-03 DIAGNOSIS — K635 Polyp of colon: Secondary | ICD-10-CM | POA: Diagnosis not present

## 2020-01-03 DIAGNOSIS — K5730 Diverticulosis of large intestine without perforation or abscess without bleeding: Secondary | ICD-10-CM | POA: Diagnosis not present

## 2020-01-03 DIAGNOSIS — D122 Benign neoplasm of ascending colon: Secondary | ICD-10-CM

## 2020-01-03 DIAGNOSIS — K529 Noninfective gastroenteritis and colitis, unspecified: Secondary | ICD-10-CM

## 2020-01-03 MED ORDER — SODIUM CHLORIDE 0.9 % IV SOLN: 500.0000 mL | Freq: Once | INTRAVENOUS | Status: AC

## 2020-01-03 NOTE — Progress Notes (Signed)
Temp-LC VS-KA

## 2020-01-03 NOTE — Progress Notes (Signed)
Called to room to assist during endoscopic procedure.  Patient ID and intended procedure confirmed with present staff. Received instructions for my participation in the procedure from the performing physician.  

## 2020-01-03 NOTE — Progress Notes (Signed)
To PACU, VSS. Report to [email or web address].tb 

## 2020-01-03 NOTE — Patient Instructions (Signed)
YOU HAD AN ENDOSCOPIC PROCEDURE TODAY AT [facility]:   Refer to the procedure report that was given to you for any specific questions about what was found during the examination.  If the procedure report does not answer your questions, please call your gastroenterologist to clarify.  If you requested that your care partner not be given the details of your procedure findings, then the procedure report has been included in a sealed envelope for you to review at your convenience later.  YOU SHOULD EXPECT: Some feelings of bloating in the abdomen. Passage of more gas than usual.  Walking can help get rid of the air that was put into your GI tract during the procedure and reduce the bloating. If you had a lower endoscopy (such as a colonoscopy or flexible sigmoidoscopy) you may notice spotting of blood in your stool or on the toilet paper. If you underwent a bowel prep for your procedure, you may not have a normal bowel movement for a few days.  Please Note:  You might notice some irritation and congestion in your nose or some drainage.  This is from the oxygen used during your procedure.  There is no need for concern and it should clear up in a day or so.  SYMPTOMS TO REPORT IMMEDIATELY:   Following lower endoscopy (colonoscopy or flexible sigmoidoscopy):  Excessive amounts of blood in the stool  Significant tenderness or worsening of abdominal pains  Swelling of the abdomen that is new, acute  Fever of 100F or higher   Following upper endoscopy (EGD)  Vomiting of blood or coffee ground material  New chest pain or pain under the shoulder blades  Painful or persistently difficult swallowing  New shortness of breath  Fever of 100F or higher  Black, tarry-looking stools  For urgent or emergent issues, a gastroenterologist can be reached at any hour by calling [phone]. Do not use MyChart messaging for urgent concerns.    DIET:  We do recommend a small meal at first, but  then you may proceed to your regular diet.  Drink plenty of fluids but you should avoid alcoholic beverages for 24 hours. Follow a High Fiber Diet. Drink at least 64 ounces of water daily. Add a daily stool bulking agent such as psyllium (an example would be Metamucil).  MEDICATIONS: Continue present medications. No Aspirin, Ibuprofen, Naproxen, or other non-steroidal anti-inflammatory drugs.  FOLLOW UP: Follow up with Dr. Tarri Glenn in her office in 4-8 weeks to review these results.  Please see handouts given to you by your recovery nurse.  ACTIVITY:  You should plan to take it easy for the rest of today and you should NOT DRIVE or use heavy machinery until tomorrow (because of the sedation medicines used during the test).    FOLLOW UP: Our staff will call the number listed on your records 48-72 hours following your procedure to check on you and address any questions or concerns that you may have regarding the information given to you following your procedure. If we do not reach you, we will leave a message.  We will attempt to reach you two times.  During this call, we will ask if you have developed any symptoms of COVID 19. If you develop any symptoms (ie: fever, flu-like symptoms, shortness of breath, cough etc.) before then, please call [phone].  If you test positive for Covid 19 in the 2 weeks post procedure, please call and report this information to [place].    If  any biopsies were taken you will be contacted by phone or by letter within the next 1-3 weeks.  Please call us at [phone] if you have not heard about the biopsies in 3 weeks.   Thank you for allowing [place] to provide for your healthcare needs today.   SIGNATURES/CONFIDENTIALITY: You and/or your care partner have signed paperwork which will be entered into your electronic medical record.  These signatures attest to the fact that that the information above on your After Visit Summary has been reviewed and is understood.  Full  responsibility of the confidentiality of this discharge information lies with you and/or your care-partner.

## 2020-01-03 NOTE — Op Note (Signed)
[facility] Patient Name: Deborah Perkins Procedure Date: 01/03/2020 10:48 AM MRN: [id number] Endoscopist: Thornton Park MD, MD Age: 59 Referring MD:  Date of Birth: [birth year] Gender: Female Account #: [id number] Procedure:                Colonoscopy Indications:              Abdominal pain in the left lower quadrant                           Recent Enterocolitis on CT Medicines:                Monitored Anesthesia Care Procedure:                Pre-Anesthesia Assessment:                           - Prior to the procedure, a History and Physical                            was performed, and patient medications and                            allergies were reviewed. The patient's tolerance of                            previous anesthesia was also reviewed. The risks                            and benefits of the procedure and the sedation                            options and risks were discussed with the patient.                            All questions were answered, and informed consent                            was obtained. Prior Anticoagulants: The patient has                            taken no previous anticoagulant or antiplatelet                            agents. ASA Grade Assessment: II - A patient with                            mild systemic disease. After reviewing the risks                            and benefits, the patient was deemed in                            satisfactory condition to undergo the procedure.  After obtaining informed consent, the colonoscope                            was passed under direct vision. Throughout the                            procedure, the patient's blood pressure, pulse, and                            oxygen saturations were monitored continuously. The                            Colonoscope was introduced through the anus and                            advanced to the the cecum, identified by                             appendiceal orifice and ileocecal valve. The                            colonoscopy was performed without difficulty. The                            patient tolerated the procedure well. The quality                            of the bowel preparation was good. The terminal                            ileum, ileocecal valve, appendiceal orifice, and                            rectum were photographed. Scope In: 10:57:49 AM Scope Out: 11:07:22 AM Scope Withdrawal Time: 0 hours 6 minutes 59 seconds  Total Procedure Duration: 0 hours 9 minutes 33 seconds  Findings:                 Hemorrhoids were found on perianal exam.                           A few small-mouthed diverticula were found in the                            sigmoid colon and descending colon.                           A 3 mm polyp was found in the ascending colon. The                            polyp was flat. The polyp was removed with a cold                            snare. Resection and retrieval were complete.  Internal hemorrhoids present. The exam was                            otherwise without abnormality on direct and                            retroflexion views. Complications:            No immediate complications. Estimated blood loss:                            Minimal. Estimated Blood Loss:     Estimated blood loss was minimal. Impression:               - Internal and external hemorrhoids.                           - Diverticulosis in the sigmoid colon and in the                            descending colon.                           - One 3 mm polyp in the ascending colon, removed                            with a cold snare. Resected and retrieved.                           - The examination was otherwise normal on direct                            and retroflexion views.                           - No source for pain identified on this study. Recommendation:            - Patient has a contact number available for                            emergencies. The signs and symptoms of potential                            delayed complications were discussed with the                            patient. Return to normal activities tomorrow.                            Written discharge instructions were provided to the                            patient.                           - Continue present medications.                           -  Await pathology results.                           - Repeat colonoscopy date to be determined after                            pending pathology results are reviewed for                            surveillance.                           - Follow a high fiber diet. Drink at least 64                            ounces of water daily. Add a daily stool bulking                            agent such as psyllium (an exampled would be                            Metamucil).                           - Emerging evidence supports eating a diet of                            fruits, vegetables, grains, calcium, and yogurt                            while reducing red meat and alcohol may reduce the                            risk of colon cancer.                           - Thank you for allowing me to be involved in your                            colon cancer prevention. Thornton Park MD, MD 01/03/2020 11:17:11 AM This report has been signed electronically.

## 2020-01-03 NOTE — Op Note (Signed)
[facility] Patient Name: Deborah Perkins Procedure Date: 01/03/2020 10:48 AM MRN: [id number] Endoscopist: Thornton Park MD, MD Age: 59 Referring MD:  Date of Birth: 05-06-1961 Gender: Female Account #: [id number] Procedure:                Upper GI endoscopy Indications:              Abdominal pain in the left lower quadrant, Nausea Medicines:                Monitored Anesthesia Care Procedure:                Pre-Anesthesia Assessment:                           - Prior to the procedure, a History and Physical                            was performed, and patient medications and                            allergies were reviewed. The patient's tolerance of                            previous anesthesia was also reviewed. The risks                            and benefits of the procedure and the sedation                            options and risks were discussed with the patient.                            All questions were answered, and informed consent                            was obtained. Prior Anticoagulants: The patient has                            taken no previous anticoagulant or antiplatelet                            agents. ASA Grade Assessment: II - A patient with                            mild systemic disease. After reviewing the risks                            and benefits, the patient was deemed in                            satisfactory condition to undergo the procedure.                           After obtaining informed consent, the endoscope was  passed under direct vision. Throughout the                            procedure, the patient's blood pressure, pulse, and                            oxygen saturations were monitored continuously. The                            Endoscope was introduced through the mouth, and                            advanced to the third part of duodenum. The upper                            GI  endoscopy was accomplished without difficulty.                            The patient tolerated the procedure well. Scope In: Scope Out: Findings:                 The examined esophagus was normal. Biopsies were                            taken from the distal esophagus with a cold forceps                            for histology. Biopsies were taken with a cold                            forceps for histology. Estimated blood loss was                            minimal.                           Diffuse mildly erythematous mucosa without bleeding                            was found in the gastric antrum. Biopsies were                            taken from the antrum, body, and fundus with a cold                            forceps for histology. Estimated blood loss was                            minimal.                           Diffuse mildly erythematous mucosa without active                            bleeding and with no  stigmata of bleeding was found                            in the duodenal bulb. Biopsies were taken with a                            cold forceps for histology. Estimated blood loss                            was minimal.                           The cardia and gastric fundus were normal on                            retroflexion.                           The exam was otherwise without abnormality. Complications:            No immediate complications. Estimated blood loss:                            Minimal. Estimated Blood Loss:     Estimated blood loss was minimal. Impression:               - Normal esophagus. Biopsied.                           - Erythematous mucosa in the antrum. Biopsied.                           - Erythematous duodenopathy. Biopsied.                           - The examination was otherwise normal. Recommendation:           - Patient has a contact number available for                            emergencies. The signs and symptoms of  potential                            delayed complications were discussed with the                            patient. Return to normal activities tomorrow.                            Written discharge instructions were provided to the                            patient.                           - Resume previous diet.                           -  Continue present medications. Increase omeprazole                            to 40 mg twice daily for at least 8 weeks.                           - No aspirin, ibuprofen, naproxen, or other                            non-steroidal anti-inflammatory drugs.                           - Await pathology results.                           - Follow-up in the office in 4-8 weeks to review                            these results. Thornton Park MD, MD 01/03/2020 11:14:07 AM This report has been signed electronically.

## 2020-01-07 ENCOUNTER — Telehealth: Payer: Self-pay | Admitting: *Deleted

## 2020-01-07 DIAGNOSIS — R1032 Left lower quadrant pain: Secondary | ICD-10-CM

## 2020-01-07 MED ORDER — OMEPRAZOLE 40 MG PO CPDR: DELAYED_RELEASE_CAPSULE | ORAL | 3 refills | Status: DC

## 2020-01-07 NOTE — Telephone Encounter (Signed)
  Follow up Call-  Call back number 01/03/2020  Post procedure Call Back phone  # [phone]  Permission to leave phone message Yes  Some recent data might be hidden     Patient questions:  Do you have a fever, pain , or abdominal swelling? No. Pain Score  0 *  Have you tolerated food without any problems? Yes.    Have you been able to return to your normal activities? Yes.    Do you have any questions about your discharge instructions: Diet   No. Medications  No. Follow up visit  No.  Do you have questions or concerns about your Care? No.  Actions: * If pain score is 4 or above: No action needed, pain <4.  1. Have you developed a fever since your procedure? no  2.   Have you had an respiratory symptoms (SOB or cough) since your procedure? no  3.   Have you tested positive for COVID 19 since your procedure no  4.   Have you had any family members/close contacts diagnosed with the COVID 19 since your procedure?  no   If yes to any of these questions please route to Joylene John, RN and Erenest Rasher, RN  Pt needed Omeprazole RX sent to pharmacy- she was not on this medication currently.  [facility] [place]

## 2020-01-16 ENCOUNTER — Encounter: Payer: Self-pay | Admitting: Gastroenterology

## 2020-02-02 ENCOUNTER — Telehealth: Payer: Self-pay | Admitting: Gastroenterology

## 2020-02-02 MED ORDER — METRONIDAZOLE 500 MG PO TABS
500.0000 mg | ORAL_TABLET | Freq: Two times a day (BID) | ORAL | 0 refills | Status: DC
Start: 2020-02-02 — End: 2020-02-20

## 2020-02-02 MED ORDER — CIPROFLOXACIN HCL 500 MG PO TABS: 500.0000 mg | ORAL_TABLET | Freq: Two times a day (BID) | ORAL | 0 refills | Status: DC

## 2020-02-02 NOTE — Telephone Encounter (Signed)
Patient being worked up for issues of abdominal pain. Last year she had a hospitalization at an outside facility with significant enterocolitis based on report noted by Dr. Tarri Glenn at her initial consultation. Patient had recent upper and lower endoscopy last month.  She is being treated for gastritis. She remains on PPI therapy. Over the course the last 24 to 36 hours the patient has had progressive worsening of her pain and is mostly been in the left side of her umbilicus more towards her left lower. It is similar to the discomfort she had when she went to the hospital previously. Her most recent colonoscopy suggested left-sided diverticulosis. Patient has had 4-5 bowel movements today that have been loose and watery. No blood. No fevers or chills. Certainly, this could be diverticulitis. Without the physical ability of seeing her it is hard to know what the best next steps would be but it certainly not unreasonable to try and treat her with antibiotics and see how she does. She does understand and accepts that we can only do so much over the telephone as a telemedicine discussion. If things progress even in the setting of the antibiotics, she knows that she can call us back but we would recommend then an evaluation in the hospital for further work-up and management and potential imaging. She lives in [place] right now so cannot come to [place]. I have discussed this with the patient and the patient's husband. They agree to this plan of action. [facility] [place] I will forward this to Dr. Tarri Glenn for her follow-up next week. Patient can call back if necessary to let us know she is having worsening issues but again, she would need to be proceeding to the emergency department for further evaluation at that point.   Justice Britain, MD [facility] Office # [phone]

## 2020-02-05 NOTE — Telephone Encounter (Signed)
Thank you. I agree with your recommendations.

## 2020-02-05 NOTE — Telephone Encounter (Signed)
Spoke with patient, pt states that she is feeling better and that since taking the medications she has not had any pain in her stomach. Pt states that she is still having really loose stools, pt reports 2 bowel movements a day, pt denies any blood in stool that she could notice. Pt states that she forgot to mention the day before she called the office she passed gas and was slightly incontinent, pt states that she noticed some clear mucous in her stool that day. Pt reports still having the urgency to have a BM, pt states that when she feels like she has to go she has to go at that moment. Pt states that she can now press on her left side near umbilicus with no pain, pt reports previous swelling at this location. Pt advised to complete full course of antibiotics and to call office with any other questions or concerns.

## 2020-02-05 NOTE — Telephone Encounter (Signed)
Deborah Perkins, Please let me know how she is feeling today. Thank you.

## 2020-02-20 ENCOUNTER — Encounter: Payer: Self-pay | Admitting: Gastroenterology

## 2020-02-20 ENCOUNTER — Ambulatory Visit (INDEPENDENT_AMBULATORY_CARE_PROVIDER_SITE_OTHER): Payer: No Typology Code available for payment source | Admitting: Gastroenterology

## 2020-02-20 VITALS — BP 116/84 | HR 88 | Ht 66.5 in | Wt 226.5 lb

## 2020-02-20 DIAGNOSIS — K2990 Gastroduodenitis, unspecified, without bleeding: Secondary | ICD-10-CM

## 2020-02-20 DIAGNOSIS — K2970 Gastritis, unspecified, without bleeding: Secondary | ICD-10-CM

## 2020-02-20 DIAGNOSIS — K5731 Diverticulosis of large intestine without perforation or abscess with bleeding: Secondary | ICD-10-CM

## 2020-02-20 DIAGNOSIS — R1032 Left lower quadrant pain: Secondary | ICD-10-CM | POA: Diagnosis not present

## 2020-02-20 MED ORDER — CIPROFLOXACIN HCL 500 MG PO TABS: 500.0000 mg | ORAL_TABLET | Freq: Two times a day (BID) | ORAL | 0 refills | Status: DC

## 2020-02-20 MED ORDER — METRONIDAZOLE 500 MG PO TABS: 500.0000 mg | ORAL_TABLET | Freq: Two times a day (BID) | ORAL | 0 refills | Status: DC

## 2020-02-20 NOTE — Patient Instructions (Signed)
Please start taking a daily stool bulking agent such a psyllium. Benefiber is a good choice.   Continue to take a daily probiotic - Align recommended.  Continue daily pantoprazole for reflux and mild gastritis.  I am giving you prescriptions for Cipro and Flagyl that you can use if you have abdominal pain again. If your symptoms recur and you start the antibiotics, please call me with an update in her symptoms.  Given your precancerous polyp, you should have another colonoscopy in 5 years. All of your first degree family members (daughters, sister, parents) should start colon cancer screening at age 34.

## 2020-02-20 NOTE — Progress Notes (Signed)
Referring Provider: Prince Solian, MD Primary Care Physician:  Prince Solian, MD  Chief complaint:  Recurrent abdominal pain   IMPRESSION:  Intermittent LLQ pain and nausea, post-prandial stooling Enterocolitis on CT on ER visit for abdominal pain 09/2019 Left-sided Diverticulosis Fatty liver on CT History of colon polyps    - SSP on colonoscopy 12/2019    - surveillance colonoscopy in 5 years GERD controlled on pantoprazole  Recurrent LLQ pain: Developed following acute CT-diagnosed enteritis. 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 As she is spending time frequently in 4 different places, I having given her scripts for antibiotic refills to use PRN. She will call with symptom onset so that we can try to arrange for another contrasted-CT during acute symtoms.   Gastritis: Avoid NSAIDs. Continue pantoprazole.  GERD: Well controlled on daily pantoprazole.     PLAN: - Add a daily stool bulking agent such a psyllium - Continue daily probiotic - Align recommended - Continue daily pantoprazole for GERD - Avoid all NSAIDs - Scripts for Cipro/Flagyl provided to use with recurrent symptoms - Trial of Bentyl if symptoms do not improve with psyllium - Obtain CT scan from [facility] from 2019 or 2020 (not 2021 CT results) - Colonoscopy in 5 years - First degree family members should start colon cancer screening at age 49 - Follow-up 6 months, earlier if needed   Please see the "Patient Instructions" section for addition details about the plan.  HPI: Deborah Perkins is a 59 y.o. female who returns in scheduled follow-up  The interval history is obtained to the patient and review of her electronic health record. Lives half the year in [place], [place] and half the year in [place].  She has hyperlipidemia, chronic bladder dysfunction, obesity, hypothyroidism, mood disorder, restless leg syndrome, obstructive sleep apnea not requiring CPAP, and malrotation of the small  bowel.  History of GERD well-controlled on PPI.  Husband has MS. Retired Optometrist.   Was seen in in the ED at [facility] in [place] on October 02, 2019 for diarrhea, abdominal pain, and fever.  CBC, CMP, and urinalysis were normal.  A CT of the abdomen and pelvis with IV contrast found mild wall thickening and enhancement involving the distal small bowel and colon consistent with enterocolitis.  Also identified was mild edema in the small bowel mesentery, mild diverticulosis, cholelithiasis, fatty liver, and a small hiatal hernia.  Ongoing long-term nausea and intermittent LLQ pain that radiates to the umbilicus. Occasional diarrhea.  Drinks Sprite to control the nausea. Pain is intermittent over 5 hours periods at a time. No change in symptoms with defecation, movement, or eating.  No blood or mucous in the stool. Good appetite. Weight stable.   Denies a precipitating event, trauma, close contacts with similar symptoms, changes in diet, recent travel or antibiotic use. No one else at home with similar symptoms.   Evaluation by GYN and was negative. No improvement with Florastor. Previously on melaxicam and stopped in January 2021 after her trip to the ED.    EGD and colonoscopy 01/03/20 revealed:  revealed a SSP and left-sided diverticulosis. - Internal and external hemorrhoids. - Diverticulosis in the sigmoid colon and in the descending colon. - Mild, h pylori negative chronic gastritis - Duodenitis   Called in May with severe LLQ pain. Treated with Cipro/Flagyl by the on-call doctor. Pain resolved within 2 days.  Early AM post-prandial defecation. May have a second bowel later in the day.  No new complaints or  concerns.  Weight is stable.   Labs from 10/09/2019 show an iron of 77, iron saturation 28%, hemoglobin 14.4  No known family history of colon cancer or polyps. No family history of uterine/endometrial cancer, pancreatic cancer or gastric/stomach cancer.  Endoscopic  history - Screening colonoscopy 06/14/12: normal, high quality colonoscopy - Colonoscopy 01/03/20: SSP, left sided diverticulosis, internal and external hemorrhoids - EGD 01/03/20: H pylori negative gastritis, duodenitis Past Medical History:  Diagnosis Date  . Depression   . DJD (degenerative joint disease)   . Fibrocystic breast disease   . Hyperlipidemia   . Obesity   . OSA (obstructive sleep apnea)   . Osteopenia   . Restless leg syndrome   . Seasonal allergies   . Thyroid disease   . Varicose veins of both legs with edema     Past Surgical History:  Procedure Laterality Date  . APPENDECTOMY  1990   done when operated for malrotation,   . FOOT SURGERY     multiple surgeries on both feet  . 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 ALPRAZolam (XANAX) 0.25 MG tablet Take 0.25 mg by mouth 3 (three) times daily as needed.    Marland Kitchen BIOTIN FORTE PO Take by mouth.    Marland Kitchen buPROPion (WELLBUTRIN XL) 300 MG 24 hr tablet 300 mg every 7 (seven) days.     . [facility] DIGESTIVE PROBIOTIC 250 MG capsule Take 500 mg by mouth daily.    . diclofenac sodium (VOLTAREN) 1 % GEL daily as needed.  2  . fluticasone (FLONASE) 50 MCG/ACT nasal spray Place 2 sprays into both nostrils daily.    Marland Kitchen levothyroxine (SYNTHROID) 88 MCG tablet Take by mouth.    . LYSINE PO Take by mouth.    . Multiple Vitamins-Calcium (ONE-A-DAY WOMENS FORMULA PO) Take by mouth daily.      Marland Kitchen omeprazole (PRILOSEC) 40 MG capsule Take 1 tablet twice daily for 8 weeks- take 1 30 minutes before breakfast and 30 minutes before dinner 60 capsule 3  . pantoprazole (PROTONIX) 40 MG tablet Take 40 mg by mouth daily.  1  . rOPINIRole (REQUIP) 0.5 MG tablet SMARTSIG:1 Tablet(s) By Mouth Every Evening    . sertraline (ZOLOFT) 100 MG tablet Take 100 mg by mouth daily.    . valACYclovir (VALTREX) 1000 MG tablet 1,000 mg as needed.       . zolpidem (AMBIEN) 10 MG tablet 10 mg as needed.      Current Facility-Administered Medications  Medication Dose Route Frequency Provider Last Rate Last Admin  . 0.9 %  sodium chloride infusion  500 mL Intravenous Once Thornton Park, MD        Allergies as of 02/20/2020  . (No Known Allergies)    Family History  Problem Relation Age of Onset  . Breast cancer Mother 66  . Stroke Paternal Uncle   . Colon cancer Neg Hx   . Esophageal cancer Neg Hx   . Rectal cancer Neg Hx   . Stomach cancer Neg Hx     Social History   Socioeconomic History  . Marital status: Married    Spouse name: Not on file  . Number of children: Not on file  . Years of education: Not on file  . Highest education level: Not on file  Occupational History  . Occupation: homemaker  Tobacco Use  . Smoking status: Never Smoker  .  Smokeless tobacco: Never Used  Vaping Use  . Vaping Use: Never used  Substance and Sexual Activity  . Alcohol use: Yes    Alcohol/week: 3.0 standard drinks    Types: 3 Standard drinks or equivalent per week    Comment: glass of red wine  . Drug use: No  . Sexual activity: Yes    Partners: Male    Birth control/protection: Post-menopausal  Other Topics Concern  . Not on file  Social History Narrative  . Not on file   Social Determinants of Health   Financial Resource Strain:   . Difficulty of Paying Living Expenses:   Food Insecurity:   . Worried About Charity fundraiser in the Last Year:   . Arboriculturist in the Last Year:   Transportation Needs:   . Film/video editor (Medical):   Marland Kitchen Lack of Transportation (Non-Medical):   Physical Activity:   . Days of Exercise per Week:   . Minutes of Exercise per Session:   Stress:   . Feeling of Stress :   Social Connections:   . Frequency of Communication with Friends and Family:   . Frequency of Social Gatherings with Friends and Family:   . Attends Religious Services:   . Active Member of Clubs or  Organizations:   . Attends Archivist Meetings:   Marland Kitchen Marital Status:   Intimate Partner Violence:   . Fear of Current or Ex-Partner:   . Emotionally Abused:   Marland Kitchen Physically Abused:   . Sexually Abused:    Physical Exam: General:   Alert,  well-nourished, pleasant and cooperative in NAD. Wearing hearing aides.  Gen: Awake, alert, and oriented, and well communicative. HEENT: EOMI, non-icteric sclera, NCAT, MMM  Neck: Normal movement of head and neck  Pulm: No labored breathing, speaking in full sentences without conversational dyspnea  Derm: No apparent lesions or bruising in visible field  MS: Moves all visible extremities without noticeable abnormality  Psych: Pleasant, cooperative, normal speech, thought processing seemingly intact        Lajoya Dombek L. Tarri Glenn, MD, MPH 02/20/2020, 8:52 AM

## 2020-02-25 ENCOUNTER — Other Ambulatory Visit: Payer: Self-pay

## 2020-02-25 DIAGNOSIS — R1032 Left lower quadrant pain: Secondary | ICD-10-CM

## 2020-02-25 MED ORDER — DICYCLOMINE HCL 20 MG PO TABS: 20.0000 mg | ORAL_TABLET | Freq: Four times a day (QID) | ORAL | 0 refills | Status: DC

## 2020-03-18 ENCOUNTER — Other Ambulatory Visit: Payer: Self-pay | Admitting: Gastroenterology

## 2020-03-18 DIAGNOSIS — R1032 Left lower quadrant pain: Secondary | ICD-10-CM

## 2020-04-01 ENCOUNTER — Other Ambulatory Visit: Payer: Self-pay | Admitting: Gastroenterology

## 2020-04-01 DIAGNOSIS — R1032 Left lower quadrant pain: Secondary | ICD-10-CM

## 2020-05-07 ENCOUNTER — Other Ambulatory Visit: Payer: Self-pay | Admitting: Gastroenterology

## 2020-05-07 DIAGNOSIS — R1032 Left lower quadrant pain: Secondary | ICD-10-CM

## 2020-05-15 ENCOUNTER — Other Ambulatory Visit: Payer: Self-pay | Admitting: Obstetrics & Gynecology

## 2020-05-15 DIAGNOSIS — Z1231 Encounter for screening mammogram for malignant neoplasm of breast: Secondary | ICD-10-CM

## 2020-06-20 ENCOUNTER — Ambulatory Visit
Admission: RE | Admit: 2020-06-20 | Discharge: 2020-06-20 | Disposition: A | Discharge status: Home / Self Care | Payer: No Typology Code available for payment source | Source: Ambulatory Visit | Attending: Obstetrics & Gynecology | Admitting: Obstetrics & Gynecology

## 2020-06-20 ENCOUNTER — Other Ambulatory Visit: Payer: Self-pay

## 2020-06-20 DIAGNOSIS — Z1231 Encounter for screening mammogram for malignant neoplasm of breast: Secondary | ICD-10-CM

## 2020-06-24 MED ORDER — PANTOPRAZOLE SODIUM 40 MG PO TBEC: 40.0000 mg | DELAYED_RELEASE_TABLET | Freq: Every day | ORAL | 1 refills | Status: AC

## 2020-06-26 ENCOUNTER — Other Ambulatory Visit: Payer: Self-pay

## 2020-12-11 ENCOUNTER — Telehealth: Payer: Self-pay | Admitting: Gastroenterology

## 2020-12-11 NOTE — Telephone Encounter (Signed)
Inbound call from patient's husband stating patient is experiencing severe pain.  Currently they're not in town but would like to know if something can be sent to help the pain.  Please advise.

## 2020-12-11 NOTE — Telephone Encounter (Signed)
Pts husband states she is having a diverticulitis flare. She has started taking her cipro/flagyl and dicyclomine. She vomited last night and again this morning. She has zofran for nausea. Discussed with him if she did not improve and continued vomiting she would need to be seen at an urgent care or ER. He wanted to know what he could take for pain, let him know she can take Tylenol for pain. Encouraged them to follow a clear liquid diet for a few days and that if she did not improve or she worsens she will need to be seen at urgent care or ER.

## 2020-12-15 ENCOUNTER — Other Ambulatory Visit: Payer: Self-pay | Admitting: Gastroenterology

## 2020-12-15 ENCOUNTER — Telehealth: Payer: Self-pay | Admitting: Gastroenterology

## 2020-12-15 NOTE — Telephone Encounter (Signed)
Vaughan Basta -   This appears to be something you were discussing with the pt husband 12/11/20. Possible need for them to go to UC if no improvements with symptoms.

## 2020-12-15 NOTE — Telephone Encounter (Signed)
Patient reports that she had a round of diverticulitis.  She reports that she started the antibiotics that were provided to her last year.  Patent is requesting a refill of cipro and flagyl to keep on hand.  .  She reports that her symptoms stared last Thursday with diarrhea, vomiting, and abdominal pain.  She reports that her symptoms have resolved with the exception of soft stools.  Going out of town and wants a refill of cipro and flagyl to keep on hand. Patient notified that Dr. Tarri Glenn will need to authorize and we will send in if approved when Dr. Tarri Glenn returns to the office.

## 2020-12-15 NOTE — Telephone Encounter (Signed)
Patient called and stated the pharmacy told her the office declined her refill for Cipro\Flagyl. Patient wondering why please advise.

## 2020-12-16 ENCOUNTER — Other Ambulatory Visit: Payer: Self-pay

## 2020-12-16 MED ORDER — CIPROFLOXACIN HCL 500 MG PO TABS: 500.0000 mg | ORAL_TABLET | Freq: Two times a day (BID) | ORAL | 0 refills | Status: DC

## 2020-12-16 MED ORDER — METRONIDAZOLE 500 MG PO TABS: 500.0000 mg | ORAL_TABLET | Freq: Two times a day (BID) | ORAL | 0 refills | Status: DC

## 2020-12-16 NOTE — Telephone Encounter (Signed)
Refill sent to pharmacy.   

## 2020-12-16 NOTE — Telephone Encounter (Signed)
She may have one additional refill on Cipro and Flagyl as prescribed in the past. Thanks.

## 2020-12-25 ENCOUNTER — Ambulatory Visit (HOSPITAL_BASED_OUTPATIENT_CLINIC_OR_DEPARTMENT_OTHER): Payer: No Typology Code available for payment source | Admitting: Obstetrics & Gynecology

## 2021-01-02 ENCOUNTER — Ambulatory Visit: Payer: 59 | Admitting: Obstetrics & Gynecology

## 2021-01-19 ENCOUNTER — Ambulatory Visit (HOSPITAL_BASED_OUTPATIENT_CLINIC_OR_DEPARTMENT_OTHER): Payer: No Typology Code available for payment source | Admitting: Obstetrics & Gynecology

## 2021-02-05 IMAGING — MG DIGITAL SCREENING BILAT W/ TOMO W/ CAD
6 of 10 series · 6 of 30 positions shown · non-contrast
Comparison: Previous exam(s).

CLINICAL DATA: Screening.

EXAM:
DIGITAL SCREENING BILATERAL MAMMOGRAM WITH TOMO AND CAD

[R MLO synth-2D (1 of 2)]
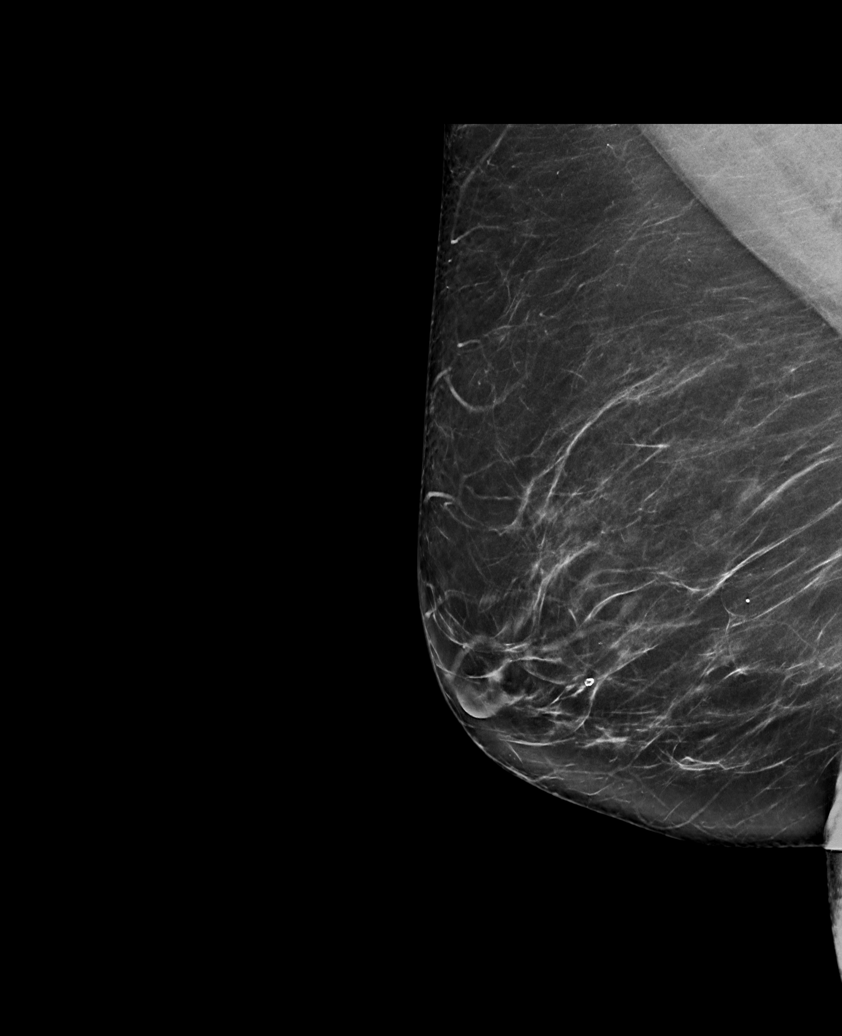

[L MLO synth-2D]
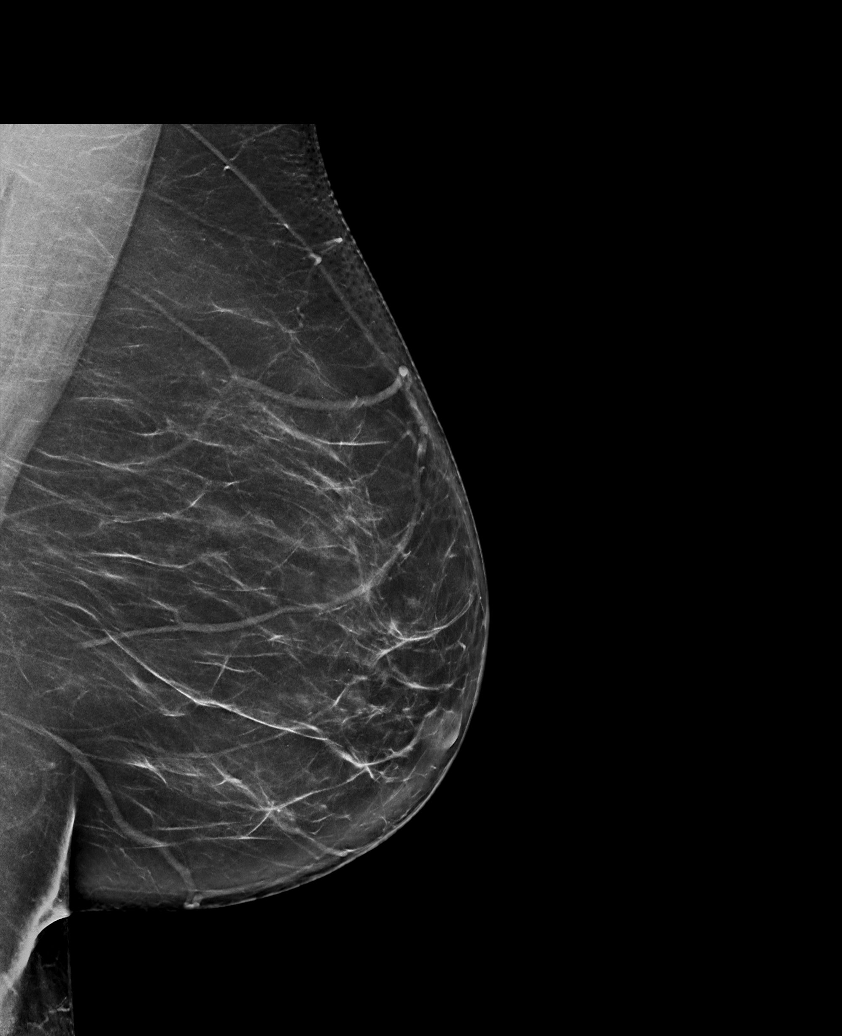

[R CC synth-2D]
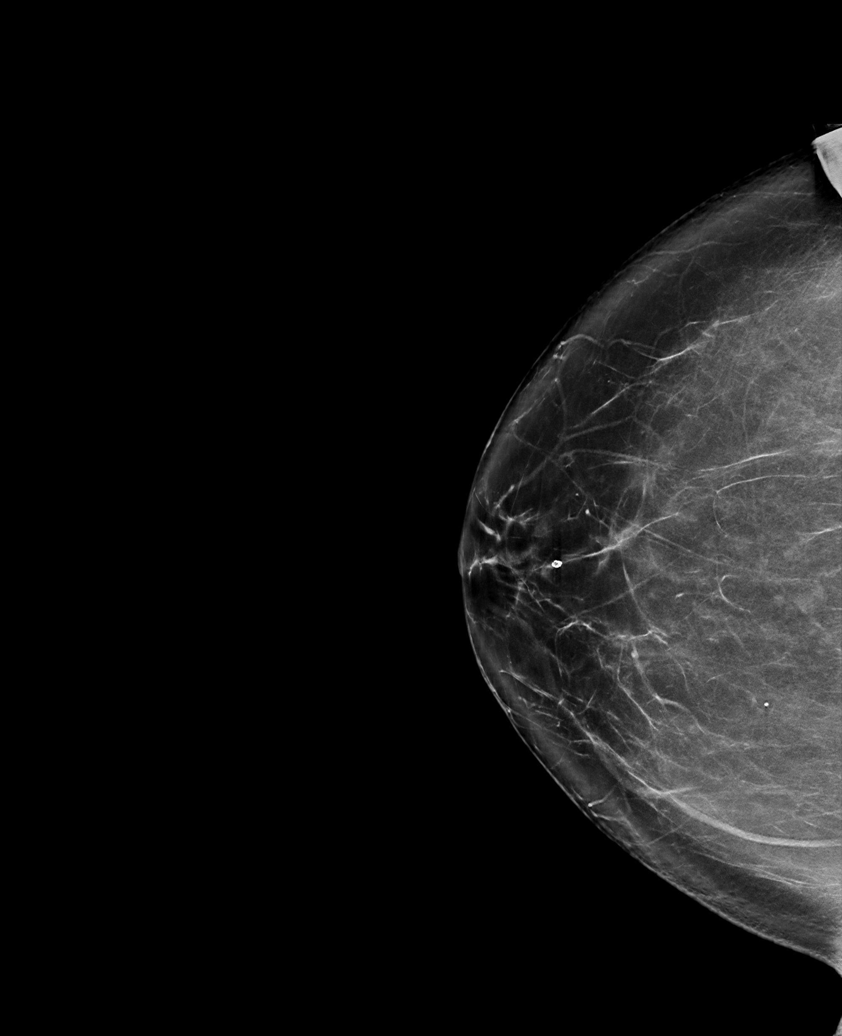

[L CC synth-2D]
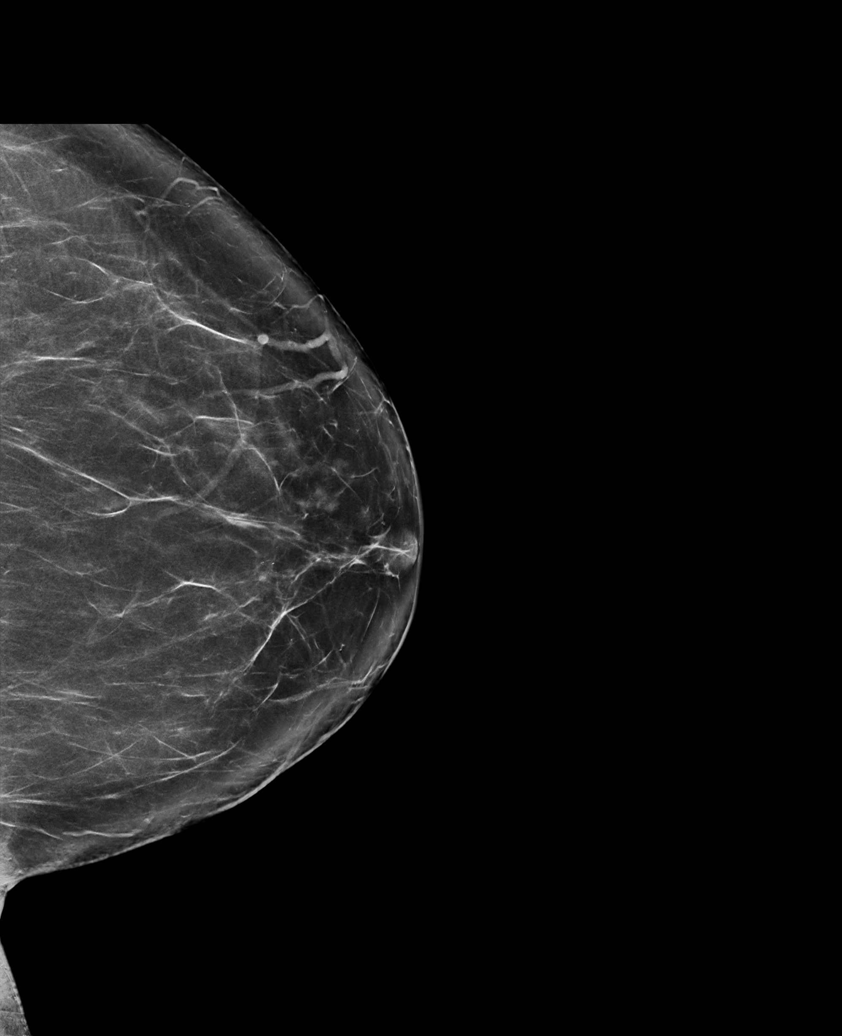

[R MLO synth-2D (2 of 2)]
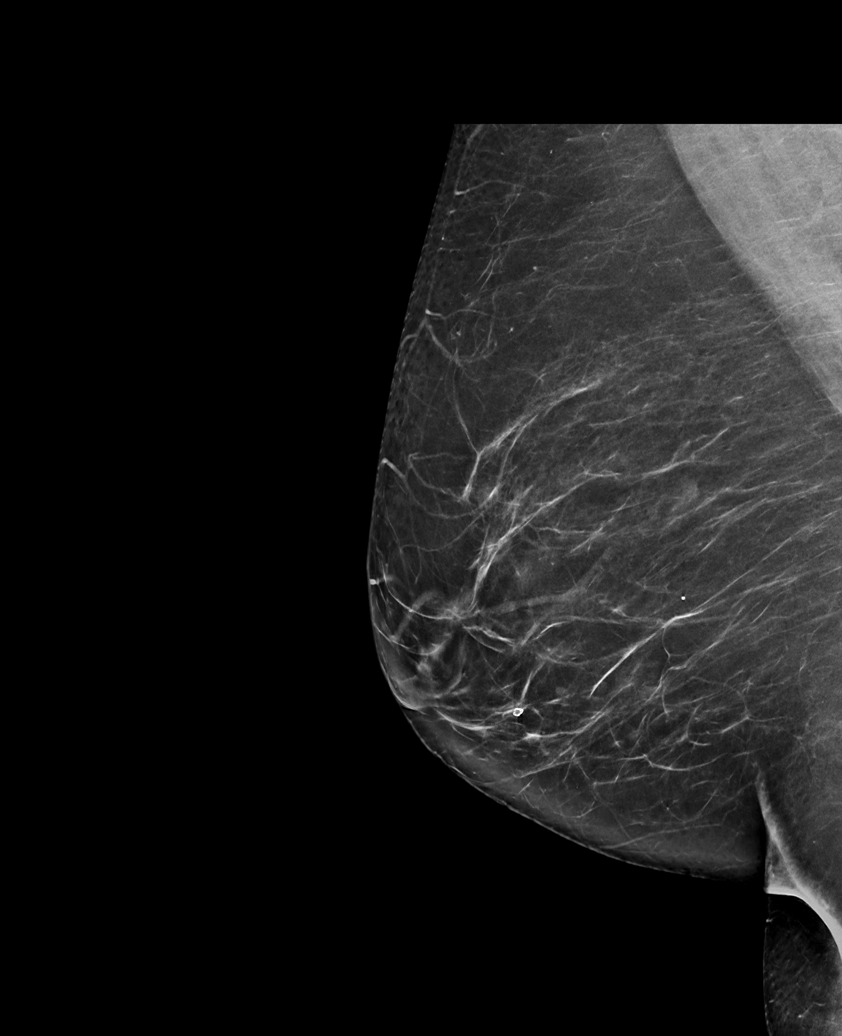

[R MLO tomo · tomo slice 40/79.0]
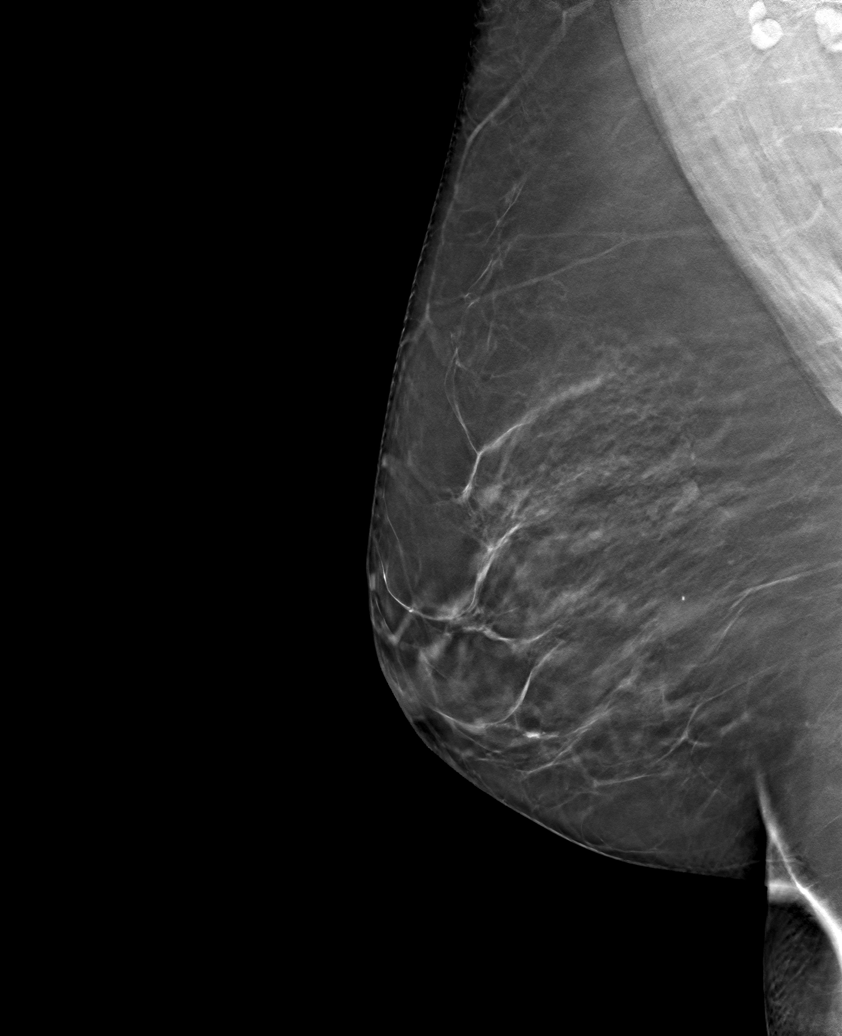

[6 of 30 positions shown; findings below may reference images not displayed]

ACR Breast Density Category b: There are scattered areas of
fibroglandular density.
FINDINGS: There are no findings suspicious for malignancy. Images were
processed with CAD.
IMPRESSION: No mammographic evidence of malignancy. A result letter of this
screening mammogram will be mailed directly to the patient.

RECOMMENDATION:
Screening mammogram in one year. (Code:[id number])

BI-RADS CATEGORY  1: Negative.

## 2021-04-11 ENCOUNTER — Other Ambulatory Visit: Payer: Self-pay | Admitting: Gastroenterology

## 2021-04-11 DIAGNOSIS — R1032 Left lower quadrant pain: Secondary | ICD-10-CM

## 2021-04-14 ENCOUNTER — Encounter: Payer: Self-pay | Admitting: *Deleted

## 2021-04-14 ENCOUNTER — Institutional Professional Consult (permissible substitution): Payer: No Typology Code available for payment source | Admitting: Neurology

## 2021-04-17 ENCOUNTER — Telehealth: Payer: Self-pay | Admitting: Gastroenterology

## 2021-04-17 NOTE — Telephone Encounter (Signed)
Pt wanted to know if the fever she has had could be related to diverticulosis. Discussed with her that fever is associated with diverticulitis, pt reports she has had that in the past and she does not have any pain and does not thinks she has diverticulitis. Pt states she will call her PCP office back and discuss this further with that office as she does not feel she has diverticulitis.

## 2021-04-17 NOTE — Telephone Encounter (Signed)
Pt called stating that she has been having low grade fever for a week. Her PCP put pt her on antibiotics, which she finished today. However the fever persists. Pt wants to know if it could be related to diverticulosis. Pls call her.

## 2021-04-21 ENCOUNTER — Ambulatory Visit (HOSPITAL_BASED_OUTPATIENT_CLINIC_OR_DEPARTMENT_OTHER): Payer: No Typology Code available for payment source | Admitting: Obstetrics & Gynecology

## 2021-04-25 ENCOUNTER — Telehealth: Payer: Self-pay | Admitting: Gastroenterology

## 2021-04-25 DIAGNOSIS — K8050 Calculus of bile duct without cholangitis or cholecystitis without obstruction: Secondary | ICD-10-CM | POA: Insufficient documentation

## 2021-04-25 NOTE — Telephone Encounter (Signed)
Deborah Perkins called the call center this evening because she was at the [facility] [place] [facility]) with choledocholithiasis (CT with multiple stones, CBD 69m, normal tbili), and her doctor was unable to find a hospital locally with ERCP capability that could accept her.  She asked if we had ERCP capability. I told her that we did indeed have ERCP capabilities and we would be able to treat her.  However, we ([facility]) cannot accept her.  She would need to have her ER provider contact our transfer center and ask our hospitalist service to accept her (assuming we have any beds available).  I spoke with the PA caring for in the ER and told her the same.  The PA said she would contact our transfer center.

## 2021-05-04 ENCOUNTER — Telehealth: Payer: Self-pay | Admitting: Gastroenterology

## 2021-05-04 NOTE — Telephone Encounter (Signed)
Pt stated  that she is post op ERCP and Gallbladder removal and has had diarrhea and low grade fever since. Pt states she is also concerned about some of her labs. Pt scheduled for an appt with Amy Esterwood on 05/05/21 at 1:30

## 2021-05-04 NOTE — Telephone Encounter (Signed)
Inbound call from patient. States she had gallbladder removed 8/23. States she does not  feel good since. Have had low grade fever, diarrhea, etc. Says she called the on call doctor and they suggested she call in to get seen today. Would like Dr. Tarri Glenn to look at labs [phone]

## 2021-05-05 ENCOUNTER — Other Ambulatory Visit (INDEPENDENT_AMBULATORY_CARE_PROVIDER_SITE_OTHER): Payer: No Typology Code available for payment source

## 2021-05-05 ENCOUNTER — Encounter: Payer: Self-pay | Admitting: Physician Assistant

## 2021-05-05 ENCOUNTER — Other Ambulatory Visit: Payer: Self-pay | Admitting: Gastroenterology

## 2021-05-05 ENCOUNTER — Ambulatory Visit (INDEPENDENT_AMBULATORY_CARE_PROVIDER_SITE_OTHER): Payer: No Typology Code available for payment source | Admitting: Physician Assistant

## 2021-05-05 VITALS — BP 120/82 | HR 91 | Ht 66.5 in | Wt 219.0 lb

## 2021-05-05 DIAGNOSIS — R7989 Other specified abnormal findings of blood chemistry: Secondary | ICD-10-CM

## 2021-05-05 DIAGNOSIS — K8050 Calculus of bile duct without cholangitis or cholecystitis without obstruction: Secondary | ICD-10-CM

## 2021-05-05 DIAGNOSIS — R197 Diarrhea, unspecified: Secondary | ICD-10-CM

## 2021-05-05 DIAGNOSIS — R5082 Postprocedural fever: Secondary | ICD-10-CM

## 2021-05-05 DIAGNOSIS — R1032 Left lower quadrant pain: Secondary | ICD-10-CM

## 2021-05-05 LAB — COMPREHENSIVE METABOLIC PANEL
ALT: 30 U/L (ref 0–35)
AST: 17 U/L (ref 0–37)
Albumin: 4 g/dL (ref 3.5–5.2)
Alkaline Phosphatase: 235 U/L — ABNORMAL HIGH (ref 39–117)
BUN: 16 mg/dL (ref 6–23)
CO2: 26 mEq/L (ref 19–32)
Calcium: 9.8 mg/dL (ref 8.4–10.5)
Chloride: 97 mEq/L (ref 96–112)
Creatinine, Ser: 1.17 mg/dL (ref 0.40–1.20)
GFR: 50.93 mL/min — ABNORMAL LOW (ref >60.00)
Glucose, Bld: 89 mg/dL (ref 70–99)
Potassium: 4.7 mEq/L (ref 3.5–5.1)
Sodium: 134 mEq/L — ABNORMAL LOW (ref 135–145)
Total Bilirubin: 0.6 mg/dL (ref 0.2–1.2)
Total Protein: 8.1 g/dL (ref 6.0–8.3)

## 2021-05-05 LAB — LIPASE: Lipase: 58 U/L (ref 11.0–59.0)

## 2021-05-05 LAB — CBC WITH DIFFERENTIAL/PLATELET
Basophils Absolute: 0.1 10*3/uL (ref 0.0–0.1)
Basophils Relative: 0.6 % (ref 0.0–3.0)
Eosinophils Absolute: 0.2 10*3/uL (ref 0.0–0.7)
Eosinophils Relative: 1.9 % (ref 0.0–5.0)
HCT: 38.8 % (ref 36.0–46.0)
Hemoglobin: 13.2 g/dL (ref 12.0–15.0)
Lymphocytes Relative: 14.2 % (ref 12.0–46.0)
Lymphs Abs: 1.5 10*3/uL (ref 0.7–4.0)
MCHC: 34.1 g/dL (ref 30.0–36.0)
MCV: 86.1 fl (ref 78.0–100.0)
Monocytes Absolute: 0.7 10*3/uL (ref 0.1–1.0)
Monocytes Relative: 7 % (ref 3.0–12.0)
Neutro Abs: 8.2 10*3/uL — ABNORMAL HIGH (ref 1.4–7.7)
Neutrophils Relative %: 76.3 % (ref 43.0–77.0)
Platelets: 400 10*3/uL (ref 150.0–400.0)
RBC: 4.51 Mil/uL (ref 3.87–5.11)
RDW: 14.1 % (ref 11.5–15.5)
WBC: 10.7 10*3/uL — ABNORMAL HIGH (ref 4.0–10.5)

## 2021-05-05 MED ORDER — CHOLESTYRAMINE 4 G PO PACK: PACK | ORAL | 1 refills | Status: DC

## 2021-05-05 MED ORDER — CHOLESTYRAMINE 4 G PO PACK
PACK | ORAL | 1 refills | Status: DC
Start: 2021-05-05 — End: 2021-05-05

## 2021-05-05 MED ORDER — DICYCLOMINE HCL 10 MG PO CAPS: 10.0000 mg | ORAL_CAPSULE | Freq: Three times a day (TID) | ORAL | 1 refills | Status: DC | PRN

## 2021-05-05 NOTE — Patient Instructions (Addendum)
If you are age 60 or older, your body mass index should be between 23-30. Your Body mass index is 34.82 kg/m. If this is out of the aforementioned range listed, please consider follow up with your Primary Care Provider. __________________________________________________________  The [facility] providers would like to encourage you to use [facility] to communicate with providers for non-urgent requests or questions.  Due to long hold times on the telephone, sending your provider a message by [facility] may be a faster and more efficient way to get a response.  Please allow 48 business hours for a response.  Please remember that this is for non-urgent requests.   Your provider has requested that you go to the basement level for lab work before leaving today. Press "B" on the elevator. The lab is located at the first door on the left as you exit the elevator.  Take dicyclomine 10 mg 1 tablet three times daily as needed  Start Cholestyramine 4 grams, 1 packet in 4 ounces of water or juice once daily 2 hours before or after meals and medications.  Follow up pending the results of your labs or as needed.  Thank you for entrusting me with your care and choosing [facility].  Amy Esterwood, PA-C

## 2021-05-05 NOTE — Progress Notes (Signed)
Subjective:    Patient ID: Deborah Perkins, female    DOB: 1960-09-28, 60 y.o.   MRN: [id number]  HPI [name] is a pleasant 60 year old white female, established with Dr. Tarri Glenn, who had previously been seen and June 2021 with complaints of recurrent abdominal pain and history of diverticulitis. Also with history of colon polyps and had undergone colonoscopy April 2021 due to findings of a possible enterocolitis on CT, this showed a few diverticuli, and a 3 mm polyp was removed which was a sessile serrated polyp, no high-grade dysplasia.  She was indicated for 5-year interval follow-up.   Patient comes in today after very recent hospitalization at [facility] last week at which time she had presented with 4 to 6-week history of generalized aching and feeling poorly, intermittent low-grade fevers with worsening of the symptoms that week also associated with some abdominal aching and back pain.  [place] [facility] due to concerns for cholecystitis, and choledocholithiasis. She underwent ERCP, sphincterotomy and stone extraction on 04/27/2021 with finding of a moderate para ampullary diverticulum, bulging ampulla, moderately dilated CBD at 9 mm, and multiple stones were removed, no stones felt to be remaining (Dr. Lennon Alstrom) She underwent laparoscopic cholecystectomy the following day.  I do not have copy of that report. She was discharged on 04/29/2021. Labs on the day of discharge with WBC of 11.9, hemoglobin 10.6/hematocrit 32 T bili 0.5/alk phos 332/ALT 73/AST 37  In the background of this patient says prior to hospitalization she had been given a Z-Pak per her PCP because of her complaints of intermittent low-grade fevers and aching.  COVID-19 testing was negative.  She says she felt better after she had taken the Z-Pak but then symptoms recurred. She had not been having any diarrhea prior to hospital admission.  She says since the  time of her surgery her generalized aching has resolved, back pain and abdominal discomfort have resolved but she has persisted to have low-grade temps in the 100 range intermittently.  She has had some vague nausea, and appetite has been somewhat decreased.  She is trying to push fluids and has been eating bland.  No vomiting.  She has developed diarrhea since surgery and is usually having 6-7 bowel movements per day associated with urgency.  Describes stool as liquid, nonbloody. She and her husband report that she had been on IV Zosyn during her hospitalization. She had contacted the surgical team at [facility] and was advised to have stool testing done by her PCP. She reports that she had a C. difficile toxin test done on 05/01/2021 per Dr. Danna Hefty office and was told this was negative.     Review of Systems Pertinent positive and negative review of systems were noted in the above HPI section.  All other review of systems was otherwise negative.   Outpatient Encounter Medications as of 05/05/2021  Medication Sig   albuterol (VENTOLIN HFA) 108 (90 Base) MCG/ACT inhaler Inhale 2 puffs into the lungs. Every 4-6 hours as needed   ALPRAZolam (XANAX) 0.25 MG tablet Take 0.25 mg by mouth 3 (three) times daily as needed.   azithromycin (ZITHROMAX) 250 MG tablet Take 500 mg by mouth daily.   B Complex-C (SUPER B COMPLEX PO) Take 1 tablet by mouth daily.   BIOTIN PO Take 1,000 mg by mouth daily.   buPROPion (WELLBUTRIN XL) 300 MG 24 hr tablet Take 300 mg by mouth daily.  cetirizine (ZYRTEC) 10 MG tablet Take 10 mg by mouth daily.   ciprofloxacin (CIPRO) 500 MG tablet Take 1 tablet (500 mg total) by mouth 2 (two) times daily. (Patient taking differently: Take 500 mg by mouth 2 (two) times daily. As needed)   clotrimazole (MYCELEX) 10 MG troche 10 mg daily as needed. Mouth/throat   diclofenac sodium (VOLTAREN) 1 % GEL daily as needed.   dicyclomine (BENTYL) 20 MG tablet TAKE 1 TAB BY MOUTH IN THE  MORNING, AT NOON, IN THE EVENING, AND AT BEDTIME (Patient taking differently: As needed)   fluticasone (FLONASE) 50 MCG/ACT nasal spray Place 2 sprays into both nostrils daily.   gabapentin (NEURONTIN) 100 MG capsule Take 100 mg by mouth daily.   LYSINE PO Take by mouth.   metroNIDAZOLE (FLAGYL) 500 MG tablet Take 1 tablet (500 mg total) by mouth 2 (two) times daily.   omeprazole (PRILOSEC) 40 MG capsule TAKE 1 CAPSULE BY MOUTH TWICE A DAY 30 MINUTES BEFORE BREAKFAST AND 30 MINUTES BEFORE DINNER (Patient taking differently: Take 40 mg by mouth daily. I capsule at bedtime)   pantoprazole (PROTONIX) 40 MG tablet Take 1 tablet (40 mg total) by mouth daily. (Patient taking differently: Take 40 mg by mouth daily. 1 capsule every AM)   rOPINIRole (REQUIP) 0.5 MG tablet Take 1.5 mg by mouth. 2-3 hours before bedtime.   sertraline (ZOLOFT) 100 MG tablet Take 100 mg by mouth daily.   valACYclovir (VALTREX) 1000 MG tablet Take 2 tablets by mouth at 1st sight of fever blister and 2 tablets every 8-12 hours later   zolpidem (AMBIEN) 10 MG tablet Take 10 mg by mouth at bedtime as needed.   [DISCONTINUED] cholestyramine (QUESTRAN) 4 g packet Use 1 packet in 4 ounces of water or juice once daily, 2 hours before or after meals and medications   [DISCONTINUED] dicyclomine (BENTYL) 10 MG capsule Take 1 capsule (10 mg total) by mouth 3 (three) times daily between meals as needed for spasms (cramping/diarrhea).   cholestyramine (QUESTRAN) 4 g packet Use 1 packet in 4 ounces of water or juice once daily, 2 hours before or after meals and medications   dicyclomine (BENTYL) 10 MG capsule Take 1 capsule (10 mg total) by mouth 3 (three) times daily between meals as needed for spasms (cramping/diarrhea).   levothyroxine (SYNTHROID) 88 MCG tablet Take by mouth.   Multiple Vitamins-Calcium (ONE-A-DAY WOMENS FORMULA PO) Take by mouth daily.     [DISCONTINUED] [employer] DIGESTIVE PROBIOTIC 250 MG capsule Take 500 mg by mouth daily.  (Patient not taking: Reported on 05/05/2021)   [DISCONTINUED] meloxicam (MOBIC) 15 MG tablet Take 15 mg by mouth daily. (Patient not taking: Reported on 05/05/2021)   Facility-Administered Encounter Medications as of 05/05/2021  Medication   0.9 %  sodium chloride infusion   No Known Allergies Patient Active Problem List   Diagnosis Date Noted   Choledocholithiasis 04/25/2021   Fibrocystic breast disease 07/08/2011   JOINT EFFUSION, LEFT KNEE 06/14/2008   BURSITIS, LEFT KNEE 06/14/2008   SACROILIAC JOINT DYSFUNCTION 06/10/2008   PREPATELLAR BURSITIS 06/10/2008   UNEQUAL LEG LENGTH 06/10/2008   Hypothyroidism 12/08/2007   Social History   Socioeconomic History   Marital status: Married    Spouse name: Not on file   Number of children: Not on file   Years of education: Not on file   Highest education level: Not on file  Occupational History   Occupation: homemaker  Tobacco Use   Smoking status: Never   Smokeless  tobacco: Never  Vaping Use   Vaping Use: Never used  Substance and Sexual Activity   Alcohol use: Yes    Alcohol/week: 3.0 standard drinks    Types: 3 Standard drinks or equivalent per week    Comment: glass of red wine   Drug use: No   Sexual activity: Yes    Partners: Male    Birth control/protection: Post-menopausal  Other Topics Concern   Not on file  Social History Narrative   Not on file   Social Determinants of Health   Financial Resource Strain: Not on file  Food Insecurity: Not on file  Transportation Needs: Not on file  Physical Activity: Not on file  Stress: Not on file  Social Connections: Not on file  Intimate Partner Violence: Not on file    Ms. Aday's family history includes Bipolar disorder in her mother; Breast cancer (age of onset: 64) in her mother; Hypertension in her father; Stroke in her paternal uncle.      Objective:    Vitals:   05/05/21 1328  BP: 120/82  Pulse: 91  SpO2: 99%    Physical Exam Well-developed  well-nourished white female in no acute distress.  Height, Weight, 219 BMI 34.8 accompanied by her husband  HEENT; nontraumatic normocephalic, EOMI, PE R LA, sclera anicteric. Oropharynx; not examined today Neck; supple, no JVD Cardiovascular; regular rate and rhythm with S1-S2, no murmur rub or gallop Pulmonary; Clear bilaterally Abdomen; soft, no focal tenderness, nondistended, no palpable mass or hepatosplenomegaly, bowel sounds are active, healing incisional ports with mild ecchymoses Rectal; not done today Skin; benign exam, no jaundice rash or appreciable lesions Extremities; no clubbing cyanosis or edema skin warm and dry Neuro/Psych; alert and oriented x4, grossly nonfocal mood and affect appropriate        Assessment & Plan:   #24 60 year old female status post laparoscopic cholecystectomy on 04/28/2021 done at [facility], and ERCP sphincterotomy and stone extraction done for choledocholithiasis on 04/27/2021. Patient had been admitted there with complaints of generalized aching, intermittent fever, vague abdominal discomfort and back pain and had been found to have elevated LFTs and leukocytosis. Postoperatively she has developed diarrhea which has persisted with 6-7 liquid urgent bowel movements per day.  She is also continuing to have intermittent low-grade fevers in the 100 range  Stool for C. difficile per PCP negative on 04/30/2021  Etiology of current symptoms is not entirely clear, rule out bile salt induced diarrhea/postcholecystectomy diarrhea, versus other infectious etiology  #2 prior history of diverticulitis # 3.  History of colon polyps-up-to-date with colonoscopy done April 2021, 1 diminutive sessile serrated polyp and indicated for 5-year interval follow-up #4 history of intestinal malrotation, status postrepair age 8  Plan CBC with differential, c-Met, lipase Repeat stool for C. difficile by PCR, GI path panel Patient has had prior prescription for  dicyclomine, refilled today and advised 10 mg p.o. 3 times daily as needed for urgency/diarrhea Will also start Questran 4 g in 4 to 6 ounces of water once daily to be taken at least 2 hours away from any other medications. She will continue to push fluids and bland diet Further recommendations pending results of labs and stool studies.  If she has persistent LFT elevation she may need imaging.  Cicely Ortner S Penina Reisner PA-C 05/05/2021   Cc: Prince Solian, MD

## 2021-05-06 ENCOUNTER — Telehealth: Payer: Self-pay

## 2021-05-06 ENCOUNTER — Other Ambulatory Visit: Payer: Self-pay | Admitting: Gastroenterology

## 2021-05-06 ENCOUNTER — Other Ambulatory Visit: Payer: No Typology Code available for payment source

## 2021-05-06 DIAGNOSIS — R1032 Left lower quadrant pain: Secondary | ICD-10-CM

## 2021-05-06 DIAGNOSIS — R7989 Other specified abnormal findings of blood chemistry: Secondary | ICD-10-CM

## 2021-05-06 DIAGNOSIS — K8050 Calculus of bile duct without cholangitis or cholecystitis without obstruction: Secondary | ICD-10-CM

## 2021-05-06 DIAGNOSIS — R5082 Postprocedural fever: Secondary | ICD-10-CM

## 2021-05-06 DIAGNOSIS — R197 Diarrhea, unspecified: Secondary | ICD-10-CM

## 2021-05-06 NOTE — Telephone Encounter (Signed)
Patient is advised.  

## 2021-05-06 NOTE — Telephone Encounter (Signed)
-----   Message from Alfredia Ferguson, PA-C sent at 05/06/2021 11:59 AM EDT ----- Please call pt and let her know her labs look good - liver tests almost back to normal . WBC 10.7 slightly elevated - not concerning - kidney function fine- no sign of dehydration . I need to know how she is feeling ? Fevers/ diarrhea?  Pain? Able to eat ? Will wait for stool studies - if she is feeling worse in any way will need CT so let me know

## 2021-05-06 NOTE — Progress Notes (Signed)
Reviewed and agree with management plans. ? ?Dauntae Derusha L. Dirck Butch, MD, MPH  ?

## 2021-05-06 NOTE — Telephone Encounter (Signed)
Patient has had 1 bowel movement today. It was not the watery diarrhea she has been experiencing. Described as mushy, pudding consistency. She ate at [facility] for breakfast. No vomiting. Thinks maybe some fever last night. Not today. Can she go to [facility], she is asking.

## 2021-05-09 LAB — CLOSTRIDIUM DIFFICILE BY PCR: Toxigenic C. Difficile by PCR: NEGATIVE

## 2021-05-10 ENCOUNTER — Telehealth: Payer: Self-pay | Admitting: Gastroenterology

## 2021-05-10 LAB — GI PROFILE, STOOL, PCR

## 2021-05-10 NOTE — Telephone Encounter (Signed)
She just called to discuss her negative stool tests.  She is still having low grade temps. The diarrhea has completely resolved. She has no abd pains.  I reviewed her records, agree that probably next best step is CT.  I don't think she needs ER evaluation this weekend and will pass this on to Dr. Tarri Glenn and Nicoletta Ba. She is eager to move on to the next step of testing.

## 2021-05-12 ENCOUNTER — Other Ambulatory Visit: Payer: Self-pay

## 2021-05-12 ENCOUNTER — Ambulatory Visit (HOSPITAL_BASED_OUTPATIENT_CLINIC_OR_DEPARTMENT_OTHER)
Admission: RE | Admit: 2021-05-12 | Discharge: 2021-05-12 | Disposition: A | Discharge status: Home / Self Care | Payer: No Typology Code available for payment source | Source: Ambulatory Visit | Attending: Gastroenterology | Admitting: Gastroenterology

## 2021-05-12 ENCOUNTER — Encounter (HOSPITAL_BASED_OUTPATIENT_CLINIC_OR_DEPARTMENT_OTHER): Payer: Self-pay

## 2021-05-12 ENCOUNTER — Telehealth: Payer: Self-pay | Admitting: Gastroenterology

## 2021-05-12 DIAGNOSIS — R194 Change in bowel habit: Secondary | ICD-10-CM | POA: Diagnosis present

## 2021-05-12 DIAGNOSIS — R509 Fever, unspecified: Secondary | ICD-10-CM

## 2021-05-12 DIAGNOSIS — R7989 Other specified abnormal findings of blood chemistry: Secondary | ICD-10-CM

## 2021-05-12 MED ORDER — IOHEXOL 350 MG/ML SOLN
75.0000 mL | Freq: Once | INTRAVENOUS | Status: AC | PRN
  Administered 2021-05-12: 75 mL via INTRAVENOUS

## 2021-05-12 NOTE — Telephone Encounter (Signed)
Authorization obtained.

## 2021-05-12 NOTE — Telephone Encounter (Signed)
Appointment Information  Name: Kelsea, Bauers MRN: [id number]  Date: 05/12/2021 Status: Sch  Time: 5:00 PM Length: 30  Visit Type: CT ABDOMEN PELVIS W CONTRAST [id number] Copay: $0.00  Provider: [facility] 1 Department: [facility] CT IMAGING  Referring Provider: [place] CSN: [id number]  Notes: Ok per Dept pt can head on over to start drinking oral prep, pt was made aware.Marland KitchenLC   Made On: 05/12/2021 1:51 PM By: Villa Herb R   Pt made aware to proceed directly to [facility] location immediately and to drink contrast on the way. Verbalized acceptance and understanding. Per Dr. Tarri Glenn, since order changed to STAT, will hold off on scheduling f/u appt and await results.

## 2021-05-12 NOTE — Telephone Encounter (Signed)
Called pt and advised of the following:  Order has been placed for CT A/P and that she will receive a call from [facility] Scheduling re: appt date and time Advised she follow up with her surgeon re: her symptoms - States she has a virtual appt today at [place] to ensure communication is sent following that visit Asked that she call me with the date and time of her CT. Advised we will need to schedule a follow up appt following that CT  Pt verbalized acceptance and understanding of all information above.

## 2021-05-12 NOTE — Telephone Encounter (Signed)
Conversation: Stool test results (Newest Message First) May 12, 2021 Me to Costella Hatcher     1:20 PM Central Scheduling will be calling you to reschedule. While we await the results of the CT, Dr. Tarri Glenn is also requesting that you follow up with your PCP to determine what other evaluation might be appropriate to better explain and/or treat your unexplained fever. Unfortunately, a fever can be caused by many things that cannot be detected and/or explained by an abdominal CT alone.  Last read by Hilton Cork Skiver at 1:31 PM on 05/12/2021.

## 2021-05-12 NOTE — Telephone Encounter (Signed)
Order for CT A/P placed. Message sent to [facility] Scheduling for scheduling purposes. Awaiting their response re: scheduling appt.

## 2021-05-12 NOTE — Addendum Note (Signed)
Addended by: Hardie Pulley, Nicolaas Savo J on: 05/12/2021 01:35 PM   Modules accepted: Orders

## 2021-05-12 NOTE — Telephone Encounter (Signed)
The site has the same NPI and Tax ID number.  She can have it at [facility], or the other hospitals.  [facility] NPI is [id number], same as [facility] and all the other hospitals.  Addresses do not matter for claims, only the NPI and Tax ID.  Her auth begins today.

## 2021-05-12 NOTE — Telephone Encounter (Signed)
Next Appt With Radiology09/13/2022 at  9:00 AM  Called pt to make her aware of this appt and became very upset stating this is unacceptable and must be performed STAT d/t her fever. Are you wanting this to be rescheduled as STAT? Location will become the issue if changing to STAT as we will be limited to availability as outpatient. Pt was informed she may proceed to ED for further workup and to ensure her CT is done STAT but she does not want to do this. Please advise

## 2021-05-12 NOTE — Telephone Encounter (Signed)
After hours call from the patient to review her CT scan results obtained to further evaluation her unexplained, postoperative fevers. Had a lap chole 04/28/21 at [facility] and ERCP sphincterotomy and stone extraction for choledocholithiasis 04/27/21. Has had persistent, intermittent low-grade fevers to 100 since that time. No true abdominal pain before or after lap chole. Some diarrhea with stool negative for C diff and negative GI pathogen panel 05/06/21. Mild leukocytosis. CT scan obtained for further evaluation.   CT resulted after the office closed today. Reviewed the findings as well as the surgery follow-up note from her virtual appointment earlier today that is viewable in [place]. Improving liver enzymes noted by the surgeon.  Deborah Perkins reports a CT obtained in [facility] prior to her recent cholecystectomy. Unfortunately, those results are not available in EPIC. No repeat imaging performed in [place] prior to her lap chole for symptomatic gallstones.   I have asked her to review these CT results with her surgeon who can hopefully compare them to preoperative imaging. If appropriate, can arrange for further investigation, including MRI, locally.  She is going to call their office tomorrow and hopes for an in-person follow-up visit.   Surgeon is Dr. Richardson Landry [phone].

## 2021-05-13 ENCOUNTER — Other Ambulatory Visit: Payer: Self-pay

## 2021-05-13 ENCOUNTER — Ambulatory Visit
Admission: RE | Admit: 2021-05-13 | Discharge: 2021-05-13 | Disposition: A | Discharge status: Home / Self Care | Payer: Self-pay | Source: Ambulatory Visit | Attending: Gastroenterology | Admitting: Gastroenterology

## 2021-05-13 DIAGNOSIS — R509 Fever, unspecified: Secondary | ICD-10-CM

## 2021-05-13 NOTE — Telephone Encounter (Signed)
Called pt to follow up about status of obtaining previous CT from [facility]. States she is picking up a disc today and was intending to bring to our office. Advised we do not have the ability to review disc in our office but rather would need to take disc to [facility]iology and request disc be uploaded to [facility] Order has been created for [employer] [facility]. Also inquired with pt about her following up with her surgeon. States she has an appt today. No further action required at this time. Will await follow up from surgeon to determine if GI needs involved in treatment plan.

## 2021-05-14 ENCOUNTER — Telehealth: Payer: Self-pay

## 2021-05-14 NOTE — Telephone Encounter (Signed)
Pt was contacted for follow up. Pt stated that she was admitted to [facility] yesterday, and plans are to do an MRI today.

## 2021-05-19 ENCOUNTER — Ambulatory Visit (HOSPITAL_BASED_OUTPATIENT_CLINIC_OR_DEPARTMENT_OTHER): Payer: No Typology Code available for payment source

## 2021-05-19 ENCOUNTER — Other Ambulatory Visit: Payer: Self-pay | Admitting: Physician Assistant

## 2021-06-03 ENCOUNTER — Other Ambulatory Visit: Payer: Self-pay | Admitting: Physician Assistant

## 2021-06-03 ENCOUNTER — Other Ambulatory Visit: Payer: Self-pay | Admitting: Gastroenterology

## 2021-06-03 DIAGNOSIS — R1032 Left lower quadrant pain: Secondary | ICD-10-CM

## 2021-06-04 ENCOUNTER — Other Ambulatory Visit: Payer: Self-pay

## 2021-06-04 DIAGNOSIS — R1032 Left lower quadrant pain: Secondary | ICD-10-CM

## 2021-06-04 MED ORDER — OMEPRAZOLE 40 MG PO CPDR: 40.0000 mg | DELAYED_RELEASE_CAPSULE | Freq: Two times a day (BID) | ORAL | 3 refills | Status: DC

## 2021-06-09 ENCOUNTER — Institutional Professional Consult (permissible substitution): Payer: No Typology Code available for payment source | Admitting: Neurology

## 2021-06-11 ENCOUNTER — Other Ambulatory Visit: Payer: Self-pay | Admitting: Physician Assistant

## 2021-06-17 ENCOUNTER — Other Ambulatory Visit: Payer: Self-pay | Admitting: Physician Assistant

## 2022-01-15 ENCOUNTER — Other Ambulatory Visit: Payer: Self-pay | Admitting: Internal Medicine

## 2022-01-15 DIAGNOSIS — M545 Low back pain, unspecified: Secondary | ICD-10-CM

## 2022-01-28 ENCOUNTER — Other Ambulatory Visit: Payer: No Typology Code available for payment source

## 2022-03-16 ENCOUNTER — Telehealth: Payer: Self-pay | Admitting: Gastroenterology

## 2022-03-16 ENCOUNTER — Other Ambulatory Visit: Payer: Self-pay

## 2022-03-16 MED ORDER — CHOLESTYRAMINE 4 G PO PACK: PACK | ORAL | 1 refills | Status: DC

## 2022-03-16 NOTE — Telephone Encounter (Signed)
Records under media from last year. Does patient need an office visit to discuss symptoms? Please advise.

## 2022-03-16 NOTE — Telephone Encounter (Signed)
Patient called requesting to speak with Dr. Tarri Glenn.  She said she had major issues last year and had records sent to Dr. Tarri Glenn from [facility] (they are in [place]).  Over the last two weeks, patient says she has diarrhea every time she eats.  She was requesting Dr. Tarri Glenn look at those records from last year and with the symptoms she's having now and give her some advice.  Thank you.

## 2022-03-16 NOTE — Telephone Encounter (Signed)
Spoke with the patient. Some of her records from the cholecystectomy and follow up after the liver abscess are viewable in [facility].  Patient calls today with concerns that she has begun having post prandial diarrhea. This usually happens in the morning but can affect her later in the day as well. She does not have fever or abdominal pain. Offered her an appointment tomorrow. Patient is leaving in the morning to go out of state and declines the appointment. She has not taken anything for her symptoms but has considered Imodium. She states she prefers to know the cause of her symptoms before she tries to treat her symptoms.  Appointment scheduled next available for when she returns to [place]. Discussed her current med list. She was given Questran by Nicoletta Ba, PA in October 2022. She will try this for the diarrhea as directed. Understands she needs evaluation if her symptoms continue.

## 2022-03-17 NOTE — Telephone Encounter (Signed)
Dr. Tarri Glenn recommendation

## 2022-04-15 ENCOUNTER — Ambulatory Visit (INDEPENDENT_AMBULATORY_CARE_PROVIDER_SITE_OTHER): Payer: No Typology Code available for payment source | Admitting: Gastroenterology

## 2022-04-15 ENCOUNTER — Encounter: Payer: Self-pay | Admitting: Gastroenterology

## 2022-04-15 ENCOUNTER — Other Ambulatory Visit (INDEPENDENT_AMBULATORY_CARE_PROVIDER_SITE_OTHER): Payer: No Typology Code available for payment source

## 2022-04-15 VITALS — BP 122/70 | HR 86 | Ht 67.0 in | Wt 245.0 lb

## 2022-04-15 DIAGNOSIS — K750 Abscess of liver: Secondary | ICD-10-CM

## 2022-04-15 DIAGNOSIS — R194 Change in bowel habit: Secondary | ICD-10-CM

## 2022-04-15 DIAGNOSIS — R197 Diarrhea, unspecified: Secondary | ICD-10-CM

## 2022-04-15 DIAGNOSIS — R1032 Left lower quadrant pain: Secondary | ICD-10-CM

## 2022-04-15 LAB — CBC WITH DIFFERENTIAL/PLATELET
Basophils Absolute: 0 10*3/uL (ref 0.0–0.1)
Basophils Relative: 0.7 % (ref 0.0–3.0)
Eosinophils Absolute: 0.1 10*3/uL (ref 0.0–0.7)
Eosinophils Relative: 0.8 % (ref 0.0–5.0)
HCT: 40.4 % (ref 36.0–46.0)
Hemoglobin: 13.5 g/dL (ref 12.0–15.0)
Lymphocytes Relative: 20.3 % (ref 12.0–46.0)
Lymphs Abs: 1.4 10*3/uL (ref 0.7–4.0)
MCHC: 33.4 g/dL (ref 30.0–36.0)
MCV: 87.3 fl (ref 78.0–100.0)
Monocytes Absolute: 0.6 10*3/uL (ref 0.1–1.0)
Monocytes Relative: 8.4 % (ref 3.0–12.0)
Neutro Abs: 4.9 10*3/uL (ref 1.4–7.7)
Neutrophils Relative %: 69.8 % (ref 43.0–77.0)
Platelets: 208 10*3/uL (ref 150.0–400.0)
RBC: 4.63 Mil/uL (ref 3.87–5.11)
RDW: 14.8 % (ref 11.5–15.5)
WBC: 7.1 10*3/uL (ref 4.0–10.5)

## 2022-04-15 LAB — COMPREHENSIVE METABOLIC PANEL
ALT: 15 U/L (ref 0–35)
AST: 17 U/L (ref 0–37)
Albumin: 4.3 g/dL (ref 3.5–5.2)
Alkaline Phosphatase: 131 U/L — ABNORMAL HIGH (ref 39–117)
BUN: 18 mg/dL (ref 6–23)
CO2: 29 mEq/L (ref 19–32)
Calcium: 9.3 mg/dL (ref 8.4–10.5)
Chloride: 102 mEq/L (ref 96–112)
Creatinine, Ser: 0.95 mg/dL (ref 0.40–1.20)
GFR: 64.96 mL/min (ref >60.00)
Glucose, Bld: 82 mg/dL (ref 70–99)
Potassium: 4.2 mEq/L (ref 3.5–5.1)
Sodium: 139 mEq/L (ref 135–145)
Total Bilirubin: 0.4 mg/dL (ref 0.2–1.2)
Total Protein: 7.3 g/dL (ref 6.0–8.3)

## 2022-04-15 MED ORDER — RIFAXIMIN 550 MG PO TABS: 550.0000 mg | ORAL_TABLET | Freq: Three times a day (TID) | ORAL | 0 refills | Status: DC

## 2022-04-15 NOTE — Patient Instructions (Signed)
It was my pleasure to provide care to you today. Based on our discussion, I am providing you with my recommendations below:  RECOMMENDATION(S):   I have recommended a CT scan given your ongoing symptoms.   In the meantime, I recommend a trial of Xifaxan for 2 weeks to try to reset the gut. If the Xifaxan is cost-prohibitive, we will try a different antibiotic.    If that does not provide relief, I recommend starting a bile acid medication such as Questran.   IMAGING:  You have been scheduled for a CT scan of the abdomen and pelvis at [facility] ([address], [address], [place], [place]). You are scheduled on Tuesday 04/20/22 at 2 pm. You should arrive 15 minutes prior to your appointment time for registration.   FOLLOW UP:  Please send me a MyChart message 2 weeks after completing the antibiotics with an update in how you are feeling.  BMI:  If you are age 21 or older, your body mass index should be between 23-30. Your Body mass index is 38.37 kg/m. If this is out of the aforementioned range listed, please consider follow up with your Primary Care Provider.  If you are age 58 or younger, your body mass index should be between 19-25. Your Body mass index is 38.37 kg/m. If this is out of the aformentioned range listed, please consider follow up with your Primary Care Provider.   MY CHART:  The [facility] providers would like to encourage you to use [facility] to communicate with providers for non-urgent requests or questions.  Due to long hold times on the telephone, sending your provider a message by [facility] may be a faster and more efficient way to get a response.  Please allow 48 business hours for a response.  Please remember that this is for non-urgent requests.   Thank you for trusting me with your gastrointestinal care!    Thornton Park, MD, MPH

## 2022-04-15 NOTE — Progress Notes (Signed)
Subjective:    Patient ID: Deborah Perkins, female    DOB: [birth year], 61 y.o.   MRN: [id number]  Diarrhea  Associated symptoms include abdominal pain.  Abdominal Pain Associated symptoms include diarrhea.   Deborah Perkins is a pleasant 61 year old white female who was last seen 05/05/21 by Nicoletta Ba one week after discharge from lap cholecystectomy for choledocholithiasis. Following that office visit, she was admitted to [facility] with multiple small liver abscesses that were too small for percutaneous drainage. Her follow-up CT 08/12/21 showed improvement without complete resolution in the abscesses.   She returns today in follow-up. She is overall not feeling well despite completing 3 months of IV antibiotics. Started to feel better around April. However, now she is having back pain.  Continues to have intermittent diarrhea with urgency. Now the stools are black described as a small, liquidy cow paddy.   Questran prescribed but not used.    Outpatient Encounter Medications as of 04/15/2022  Medication Sig   albuterol (VENTOLIN HFA) 108 (90 Base) MCG/ACT inhaler Inhale 2 puffs into the lungs. Every 4-6 hours as needed   ALPRAZolam (XANAX) 0.25 MG tablet Take 0.25 mg by mouth 3 (three) times daily as needed.   buPROPion (WELLBUTRIN XL) 300 MG 24 hr tablet Take 300 mg by mouth daily.   cetirizine (ZYRTEC) 10 MG tablet Take 10 mg by mouth daily.   cholestyramine (QUESTRAN) 4 g packet USE 1 PACKET IN 4 OUNCES OF WATER OR JUICE ONCE DAILY, 2 HOURS BEFORE OR AFTER MEALS AND MEDICATIONS   clotrimazole (MYCELEX) 10 MG troche 10 mg daily as needed. Mouth/throat   diclofenac sodium (VOLTAREN) 1 % GEL daily as needed.   dicyclomine (BENTYL) 10 MG capsule TAKE 1 CAPSULE BY MOUTH 3 TIMES A DAY BETWEEN MEALS AS NEEDED FOR SPASMS (CRAMPING/DIARRHEA)   fluticasone (FLONASE) 50 MCG/ACT nasal spray Place 2 sprays into both nostrils daily.   gabapentin (NEURONTIN) 100 MG capsule Take 100 mg by mouth daily.    omeprazole (PRILOSEC) 40 MG capsule Take 1 capsule (40 mg total) by mouth 2 (two) times daily.   pantoprazole (PROTONIX) 40 MG tablet Take 1 tablet (40 mg total) by mouth daily. (Patient taking differently: Take 40 mg by mouth daily. 1 capsule every AM)   rifaximin (XIFAXAN) 550 MG TABS tablet Take 1 tablet (550 mg total) by mouth 3 (three) times daily.   rOPINIRole (REQUIP) 0.5 MG tablet Take 1.5 mg by mouth. 2-3 hours before bedtime.   sertraline (ZOLOFT) 100 MG tablet Take 100 mg by mouth daily.   zolpidem (AMBIEN) 10 MG tablet Take 10 mg by mouth at bedtime as needed.   [DISCONTINUED] metroNIDAZOLE (FLAGYL) 500 MG tablet Take 1 tablet (500 mg total) by mouth 2 (two) times daily.   B Complex-C (SUPER B COMPLEX PO) Take 1 tablet by mouth daily. (Patient not taking: Reported on 04/15/2022)   BIOTIN PO Take 1,000 mg by mouth daily. (Patient not taking: Reported on 04/15/2022)   levothyroxine (SYNTHROID) 88 MCG tablet Take by mouth. (Patient not taking: Reported on 04/15/2022)   LYSINE PO Take by mouth. (Patient not taking: Reported on 04/15/2022)   Multiple Vitamins-Calcium (ONE-A-DAY WOMENS FORMULA PO) Take by mouth daily.   (Patient not taking: Reported on 04/15/2022)   valACYclovir (VALTREX) 1000 MG tablet Take 2 tablets by mouth at 1st sight of fever blister and 2 tablets every 8-12 hours later (Patient not taking: Reported on 04/15/2022)   [DISCONTINUED] azithromycin (ZITHROMAX) 250 MG tablet Take 500 mg by  mouth daily. (Patient not taking: Reported on 04/15/2022)   [DISCONTINUED] ciprofloxacin (CIPRO) 500 MG tablet Take 1 tablet (500 mg total) by mouth 2 (two) times daily. (Patient not taking: Reported on 04/15/2022)   Facility-Administered Encounter Medications as of 04/15/2022  Medication   0.9 %  sodium chloride infusion   No Known Allergies Patient Active Problem List   Diagnosis Date Noted   Choledocholithiasis 04/25/2021   Fibrocystic breast disease 07/08/2011   JOINT EFFUSION, LEFT KNEE  06/14/2008   BURSITIS, LEFT KNEE 06/14/2008   SACROILIAC JOINT DYSFUNCTION 06/10/2008   PREPATELLAR BURSITIS 06/10/2008   UNEQUAL LEG LENGTH 06/10/2008   Hypothyroidism 12/08/2007       Objective:    Vitals:   04/15/22 1405  BP: 122/70  Pulse: 86  SpO2: 97%    Physical Exam Well-developed well-nourished white female in no acute distress.  Height, Weight, 245 BMI 38 accompanied by her husband  HEENT; nontraumatic normocephalic, EOMI, PE R LA, sclera anicteric. Oropharynx; not examined today Neck; supple, no JVD Cardiovascular; regular rate and rhythm with S1-S2, no murmur rub or gallop Pulmonary; Clear bilaterally Abdomen; soft, no focal tenderness, nondistended, no palpable mass or hepatosplenomegaly, bowel sounds are active, well healed incisional ports with mild ecchymoses Rectal; not done today Skin; benign exam, no jaundice rash or appreciable lesions Extremities; no clubbing cyanosis or edema skin warm and dry Neuro/Psych; alert and oriented x4, grossly nonfocal mood and affect appropriate        Assessment & Plan:   #31 61 year old female with hospitalization for hepatic abscesses post-op laparoscopic cholecystectomy on 04/28/2021 done at [facility], and ERCP sphincterotomy and stone extraction done for choledocholithiasis on 04/27/2021.  Still not feeling well with abdominal pain and diarrhea.  Etiology of current symptoms is not entirely clear, rule out SIBO, post-infectious IBS, bile salt induced diarrhea/postcholecystectomy diarrhea, versus other infectious etiology. CT recommended to exclude persistent hepatic abscess.  #2 prior history of diverticulitis # 3.  History of colon polyps-up-to-date with colonoscopy done April 2021, 1 diminutive sessile serrated polyp and indicated for 5-year interval follow-up #4 history of intestinal malrotation, status post repair age 51  Plan: - CMP, CBC - CT abd/pelvis with and without contrast to follow-up on liver  abscesses - Trial of Xifaxan 550 mg TID x 14 days, will substitute with doxycyline 100 mg BID x 14 days - MyChart message follow-up 2 weeks after completing antibiotics - Will also start Questran 4 g in 4 to 6 ounces of water once daily to be taken at least 2 hours away from any other medications if no response to Xifaxan.   Thornton Park MD 04/17/2022   Cc: Prince Solian, MD

## 2022-04-16 ENCOUNTER — Encounter: Payer: Self-pay | Admitting: Gastroenterology

## 2022-04-19 NOTE — Addendum Note (Signed)
Addended by: Horris Latino on: 04/19/2022 08:42 AM   Modules accepted: Orders

## 2022-04-20 ENCOUNTER — Ambulatory Visit
Admission: RE | Admit: 2022-04-20 | Discharge: 2022-04-20 | Disposition: A | Discharge status: Home / Self Care | Payer: No Typology Code available for payment source | Source: Ambulatory Visit | Attending: Gastroenterology | Admitting: Gastroenterology

## 2022-04-20 ENCOUNTER — Inpatient Hospital Stay: Admission: RE | Admit: 2022-04-20 | Payer: No Typology Code available for payment source | Source: Ambulatory Visit

## 2022-04-20 DIAGNOSIS — K750 Abscess of liver: Secondary | ICD-10-CM

## 2022-04-20 DIAGNOSIS — R197 Diarrhea, unspecified: Secondary | ICD-10-CM

## 2022-04-20 DIAGNOSIS — R194 Change in bowel habit: Secondary | ICD-10-CM

## 2022-04-20 DIAGNOSIS — R1032 Left lower quadrant pain: Secondary | ICD-10-CM

## 2022-04-20 MED ORDER — IOPAMIDOL (ISOVUE-300) INJECTION 61%
100.0000 mL | Freq: Once | INTRAVENOUS | Status: AC | PRN
  Administered 2022-04-20: 100 mL via INTRAVENOUS

## 2022-04-22 ENCOUNTER — Encounter: Payer: Self-pay | Admitting: Gastroenterology

## 2022-05-26 ENCOUNTER — Encounter: Payer: Self-pay | Admitting: Gastroenterology

## 2022-08-09 ENCOUNTER — Other Ambulatory Visit: Payer: Self-pay | Admitting: Internal Medicine

## 2022-08-09 ENCOUNTER — Ambulatory Visit
Admission: RE | Admit: 2022-08-09 | Discharge: 2022-08-09 | Disposition: A | Discharge status: Home / Self Care | Payer: No Typology Code available for payment source | Source: Ambulatory Visit | Attending: Internal Medicine | Admitting: Internal Medicine

## 2022-08-09 DIAGNOSIS — Z1231 Encounter for screening mammogram for malignant neoplasm of breast: Secondary | ICD-10-CM

## 2022-08-23 ENCOUNTER — Encounter: Payer: Self-pay | Admitting: *Deleted

## 2022-08-24 ENCOUNTER — Encounter: Payer: Self-pay | Admitting: Neurology

## 2022-08-24 ENCOUNTER — Ambulatory Visit (INDEPENDENT_AMBULATORY_CARE_PROVIDER_SITE_OTHER): Payer: No Typology Code available for payment source | Admitting: Neurology

## 2022-08-24 VITALS — BP 139/82 | HR 89 | Ht 67.0 in

## 2022-08-24 DIAGNOSIS — G4719 Other hypersomnia: Secondary | ICD-10-CM

## 2022-08-24 DIAGNOSIS — R0683 Snoring: Secondary | ICD-10-CM

## 2022-08-24 DIAGNOSIS — E669 Obesity, unspecified: Secondary | ICD-10-CM

## 2022-08-24 DIAGNOSIS — Z9189 Other specified personal risk factors, not elsewhere classified: Secondary | ICD-10-CM | POA: Diagnosis not present

## 2022-08-24 DIAGNOSIS — R351 Nocturia: Secondary | ICD-10-CM

## 2022-08-24 DIAGNOSIS — R635 Abnormal weight gain: Secondary | ICD-10-CM | POA: Diagnosis not present

## 2022-08-24 NOTE — Progress Notes (Signed)
Subjective:    Patient ID: Deborah Perkins is a 61 y.o. female.  HPI    Star Age, MD, PhD Deborah Perkins [address] [place], Deborah Perkins   Dear Dr. Dagmar Hait,  I saw your patient, Deborah Perkins, upon your kind request in my sleep clinic today for evaluation of her sleep disorder, in particular, concern for underlying obstructive sleep apnea.  The patient is unaccompanied today.  As you know, Deborah Perkins is a 61 year old female with an underlying medical history of diverticulosis, cholelithiasis, s/p ERCP and cholecystectomy in September 2022 bladder dysfunction, hyperlipidemia, osteopenia, RLS, seasonal allergies, thyroid disease, low back pain, status post lumbar spine surgery in October 2023, history of liver abscess, status post PICC line and long-term antibiotic treatment in 2022, degenerative joint disease, and obesity, who reports snoring and excessive daytime somnolence.  Her Epworth sleepiness score is 11 out of 24, fatigue severity score is 33 out of 63.  I reviewed your office note from 01/12/2022.  I had evaluated her several years ago for sleep apnea concern.  A sleep study on 07/20/2013 showed an AHI of 4/h, O2 nadir 85%.  She has not been on PAP therapy.  She has had interim weight gain.  At the time of her sleep study in 2014 she weighed 227 lb, currently 245 pounds. She reports nonrestorative sleep, worsening daytime somnolence.  She goes to bed around 10 and rise time is around 7.  She has nocturia about 2-3 times per average night, denies recurrent morning headaches.  She lives with her husband, she currently does not work outside the home, takes care of her husband.  She lives most of the time in [place], she also visits her daughter and her family in [place], [place].  She also helps take care of her elderly parents.  She takes Ambien generic 5 mg strength as needed.  She is a  non-smoker.  Previously:  10/03/2013: 61 year old right-handed woman with an underlying medical history of allergic rhinitis, depression, bladder dysfunction, degenerative joint disease, hyperlipidemia, obesity, hypothyroidism, and varicose veins (s/p laser surgery), who returns for followup consultation of her sleep disorder. She is unaccompanied today. I first met her on 07/11/2013, and which time she complained of non-restorative sleep, snoring as well as restless sleep. She denied frank restless leg symptoms but had been told that at times she jerks or kicks in her sleep. She reported trouble with her right hip at times and often sleeps with a wedge or pillow between both knees. She is a side sleeper. She does wake up several times per night but does not have to go to the bathroom regularly. She denied morning headaches and parasomnias. She reported tooth grinding. She has recently seen her dentist who wanted to hold off on providing her with a bite guard until after her sleep study because she may end up using a oral appliance for OSA. I suggested further evaluation with a sleep study. She had a baseline sleep study on 07/20/2013 and I went over the test results with her in detail today. We also called her in November with the test results. Her sleep efficiency was reduced at 83.7% with a latency to sleep of 55.5 minutes and wake after sleep onset of 7.5 minutes with mild sleep fragmentation noted. She had a mildly elevated arousal index, primarily because of periodic leg movements. She had a normal percentage of stage I sleep, an increased percentage of stage II  sleep, and decreased percentage of slow-wave sleep, and a normal percentage of REM sleep with a normal REM latency. Moderate periodic leg movements were noted with 28.36 per hour resulting in 5.9 arousals per hour. EKG was normal. EEG was normal. Mild to moderate snoring was noted. She had a total AHI of 4 per hour. She had a baseline oxygen  saturation of 93%, with a nadir of 85%. She spent 13 minutes and 11 seconds below the saturation of 90%. Time below 88% saturation was 8 minutes and 24 seconds.   Today, she reports having had the flu recently and has a check up tomorrow with Dr. Dagmar Hait. She was treated with a Z pack and steroid shot and oral prednisone. She had fever up to 102. She is trying to lose and has started Nutrisystem. She was able to lose weight without before. She has also essentially stopped taking her Zoloft. She reduced it to every other day for a while and now is off of it for the past 2-3 weeks perhaps. She was worried about it causing her weight gain. Again, she does not endorse any restless legs syndrome type symptoms. Her husband has told her that she kicks in her sleep. She has also been given Nasacort nasal spray.  Her Past Medical History Is Significant For: Past Medical History:  Diagnosis Date   Bladder dysfunction    chronic   Depression    Diverticulosis    DJD (degenerative joint disease)    DJD (degenerative joint disease)    Fibrocystic breast disease    Gallstones    Hyperlipidemia    Obesity    OSA (obstructive sleep apnea)    mild   Osteopenia    Restless leg syndrome    Seasonal allergies    Thyroid disease    Varicose veins of both legs with edema     Her Past Surgical History Is Significant For: Past Surgical History:  Procedure Laterality Date   APPENDECTOMY  09/06/1988   done when operated for malrotation,    cervical polyp removal  1999   CHOLECYSTECTOMY     05-12-2021   ERCP     05-11-2021   FOOT SURGERY     multiple surgeries on both feet   LUMBAR SPINE SURGERY     07-05-2022   malrotation of intestines  07/07/1989   Ladd procedure by Dr Lindon Romp, incidental appendectomy done    Her Family History Is Significant For: Family History  Problem Relation Age of Onset   Breast cancer Mother 53   Bipolar disorder Mother    Hypertension Father    Stroke Paternal Uncle     Colon cancer Neg Hx    Esophageal cancer Neg Hx    Rectal cancer Neg Hx    Stomach cancer Neg Hx     Her Social History Is Significant For: Social History   Socioeconomic History   Marital status: Married    Spouse name: Not on file   Number of children: Not on file   Years of education: Not on file   Highest education level: Not on file  Occupational History   Occupation: homemaker  Tobacco Use   Smoking status: Never   Smokeless tobacco: Never  Vaping Use   Vaping Use: Never used  Substance and Sexual Activity   Alcohol use: Yes    Alcohol/week: 3.0 standard drinks of alcohol    Types: 3 Standard drinks or equivalent per week    Comment: glass of red wine  Drug use: No   Sexual activity: Yes    Partners: Male    Birth control/protection: Post-menopausal  Other Topics Concern   Not on file  Social History Narrative   Caffiene 2 cups daily, soda occasional sprite.   Social Determinants of Health   Financial Resource Strain: Not on file  Food Insecurity: Not on file  Transportation Needs: Not on file  Physical Activity: Not on file  Stress: Not on file  Social Connections: Not on file    Her Allergies Are:  No Known Allergies:   Her Current Medications Are:  Outpatient Encounter Medications as of 08/24/2022  Medication Sig   albuterol (VENTOLIN HFA) 108 (90 Base) MCG/ACT inhaler Inhale 2 puffs into the lungs. Every 4-6 hours as needed   ALPRAZolam (XANAX) 0.25 MG tablet Take 0.25 mg by mouth 3 (three) times daily as needed.   BIOTIN PO Take 1,000 mg by mouth daily.   buPROPion (WELLBUTRIN XL) 300 MG 24 hr tablet Take 300 mg by mouth daily.   cetirizine (ZYRTEC) 10 MG tablet Take 10 mg by mouth daily.   clotrimazole (MYCELEX) 10 MG troche 10 mg daily as needed. Mouth/throat   diclofenac sodium (VOLTAREN) 1 % GEL daily as needed.   fluticasone (FLONASE) 50 MCG/ACT nasal spray Place 2 sprays into both nostrils daily.   gabapentin (NEURONTIN) 100 MG capsule  Take 100 mg by mouth daily.   levothyroxine (SYNTHROID) 88 MCG tablet Take by mouth.   pantoprazole (PROTONIX) 40 MG tablet Take 1 tablet (40 mg total) by mouth daily. (Patient taking differently: Take 40 mg by mouth daily. 1 capsule every AM)   rOPINIRole (REQUIP) 0.5 MG tablet Take 1.5 mg by mouth. 2-3 hours before bedtime.   sertraline (ZOLOFT) 100 MG tablet Take 100 mg by mouth daily.   valACYclovir (VALTREX) 1000 MG tablet Take 2 tablets by mouth at 1st sight of fever blister and 2 tablets every 8-12 hours later   zolpidem (AMBIEN) 10 MG tablet Take 10 mg by mouth at bedtime as needed.   Facility-Administered Encounter Medications as of 08/24/2022  Medication   0.9 %  sodium chloride infusion  :   Review of Systems:  Out of a complete 14 point review of systems, all are reviewed and negative with the exception of these symptoms as listed below:  Review of Systems  Neurological:        Worsening symptoms of loud snoring, fatigue, hx minor OSA, RLS.  Had SS here previously.  ESS 11, FSS 33.     Objective:  Neurological Exam  Physical Exam Physical Examination:   Vitals:   08/24/22 1049  BP: 139/82  Pulse: 89    General Examination: The patient is a very pleasant 61 y.o. female in no acute distress. She appears well-developed and well-nourished and well groomed.   HEENT: Normocephalic, atraumatic, pupils are equal, round and reactive to light, extraocular tracking is good without limitation to gaze excursion or nystagmus noted. Hearing is grossly intact. Face is symmetric with normal facial animation. Speech is clear with no dysarthria noted. There is no hypophonia. There is no lip, neck/head, jaw or voice tremor. Neck is supple with full range of passive and active motion. There are no carotid bruits on auscultation. Oropharynx exam reveals: mild mouth dryness, good dental hygiene and moderate airway crowding, due to a narrow airway entry and thicker soft palate, larger  appearing uvula. Mallampati is class III. Tongue protrudes centrally and palate elevates symmetrically. Tonsils are small. Neck  size is 16 inches. She has a minimal to mild overbite.   Chest: Clear to auscultation without wheezing, rhonchi or crackles noted.  Heart: S1+S2+0, regular and normal without murmurs, rubs or gallops noted.   Abdomen: Soft, non-tender and non-distended.  Extremities: There is no pitting edema in the distal lower extremities bilaterally.   Skin: Warm and dry without trophic changes noted.   Musculoskeletal: exam reveals no obvious joint deformities.   Neurologically:  Mental status: The patient is awake, alert and oriented in all 4 spheres. Her immediate and remote memory, attention, language skills and fund of knowledge are appropriate. There is no evidence of aphasia, agnosia, apraxia or anomia. Speech is clear with normal prosody and enunciation. Thought process is linear. Mood is normal and affect is normal.  Cranial nerves II - XII are as described above under HEENT exam.  Motor exam: Normal bulk, strength and tone is noted. There is no obvious action or resting tremor.  Fine motor skills and coordination: grossly intact.  Cerebellar testing: No dysmetria or intention tremor. There is no truncal or gait ataxia.  Sensory exam: intact to light touch in the upper and lower extremities.  Gait, station and balance: She stands easily. No veering to one side is noted. No leaning to one side is noted. Posture is age-appropriate and stance is narrow based. Gait shows normal stride length and normal pace. No problems turning are noted.   Assessment and Plan:  In summary, Deborah Perkins is a very pleasant 61 y.o.-year old female with an underlying medical history of diverticulosis, cholelithiasis, s/p ERCP and cholecystectomy in September 2022 bladder dysfunction, hyperlipidemia, osteopenia, RLS, seasonal allergies, thyroid disease, low back pain, status post lumbar spine  surgery in October 2023, history of liver abscess, status post PICC line and long-term antibiotic treatment in 2022, degenerative joint disease, and obesity, whose history and physical exam are concerning for sleep disordered breathing, particularly obstructive sleep apnea.   I had a long chat with the patient about my findings and the diagnosis of sleep apnea, particularly OSA, its prognosis and treatment options. We talked about medical/conservative treatments, surgical interventions and non-pharmacological approaches for symptom control. I explained, in particular, the risks and ramifications of untreated moderate to severe OSA, especially with respect to developing cardiovascular disease down the road, including congestive heart failure (CHF), difficult to treat hypertension, cardiac arrhythmias (particularly A-fib), neurovascular complications including TIA, stroke and dementia. Even type 2 diabetes has, in part, been linked to untreated OSA. Symptoms of untreated OSA may include (but may not be limited to) daytime sleepiness, nocturia (i.e. frequent nighttime urination), memory problems, mood irritability and suboptimally controlled or worsening mood disorder such as depression and/or anxiety, lack of energy, lack of motivation, physical discomfort, as well as recurrent headaches, especially morning or nocturnal headaches. We talked about the importance of maintaining a healthy lifestyle and striving for healthy weight.  I recommended a sleep study at this time. I outlined the differences between a laboratory attended sleep study which is considered more comprehensive and accurate over the option of a home sleep test (HST); the latter may lead to underestimation of sleep disordered breathing in some instances and does not help with diagnosing upper airway resistance syndrome and is not accurate enough to diagnose primary central sleep apnea typically.  I outlined possible surgical and non-surgical  treatment options of OSA, including the use of a positive airway pressure (PAP) device (i.e. CPAP, AutoPAP/APAP or BiPAP in certain circumstances), a custom-made dental device (  aka oral appliance, which would require a referral to a specialist dentist or orthodontist typically, and is generally speaking not considered for patients with full dentures or edentulous state), upper airway surgical options, such as traditional UPPP (which is not considered a first-line treatment) or the Inspire device (hypoglossal nerve stimulator, which would involve a referral for consultation with an ENT surgeon, after careful selection, following inclusion criteria - also not first-line treatment). I explained the PAP treatment option to the patient in detail, as this is generally considered first-line treatment.  The patient indicated that she would be willing to try PAP therapy, if the need arises. I explained the importance of being compliant with PAP treatment, not only for insurance purposes but primarily to improve patient's symptoms symptoms, and for the patient's long term health benefit, including to reduce Her cardiovascular risks longer-term.    We will pick up our discussion about the next steps and treatment options after testing.  We will keep her posted as to the test results by phone call and/or MyChart messaging where possible.  We will plan to follow-up in sleep clinic accordingly as well.  I answered all her questions today and the patient was in agreement.   I encouraged her to call with any interim questions, concerns, problems or updates or email [place] through [place].  Generally speaking, sleep test authorizations may take up to 2 weeks, sometimes less, sometimes longer, the patient is encouraged to get in touch with [place] if they do not hear back from the sleep lab staff directly within the next 2 weeks.  Thank you very much for allowing me to participate in the care of this nice patient. If I can be of any  further assistance to you please do not hesitate to call me at [phone].  Sincerely,   Star Age, MD, PhD

## 2022-08-24 NOTE — Patient Instructions (Signed)

## 2022-09-30 ENCOUNTER — Telehealth: Payer: Self-pay | Admitting: Neurology

## 2022-09-30 NOTE — Telephone Encounter (Signed)
A case was started on 09/29/22.  Clinical notes were uploaded, I checked the status on the portal it is still pending.

## 2022-10-12 ENCOUNTER — Ambulatory Visit (INDEPENDENT_AMBULATORY_CARE_PROVIDER_SITE_OTHER): Payer: No Typology Code available for payment source | Admitting: Neurology

## 2022-10-12 DIAGNOSIS — R351 Nocturia: Secondary | ICD-10-CM

## 2022-10-12 DIAGNOSIS — E669 Obesity, unspecified: Secondary | ICD-10-CM

## 2022-10-12 DIAGNOSIS — R0683 Snoring: Secondary | ICD-10-CM

## 2022-10-12 DIAGNOSIS — R635 Abnormal weight gain: Secondary | ICD-10-CM

## 2022-10-12 DIAGNOSIS — G4719 Other hypersomnia: Secondary | ICD-10-CM

## 2022-10-12 DIAGNOSIS — G4733 Obstructive sleep apnea (adult) (pediatric): Secondary | ICD-10-CM

## 2022-10-12 DIAGNOSIS — G4720 Circadian rhythm sleep disorder, unspecified type: Secondary | ICD-10-CM

## 2022-10-12 DIAGNOSIS — Z9189 Other specified personal risk factors, not elsewhere classified: Secondary | ICD-10-CM

## 2022-10-12 NOTE — Telephone Encounter (Signed)
[facility] Josem Kaufmann: [id number] (exp. 09/29/22 to 04/03/23)   Patient is scheduled for 10/12/22 at 8 pm.  Patient is aware to bring comfortable clothes to sleep in, eat her evening meal, can bring a snack if she would like and bring her medications that she may take at night.

## 2022-10-20 NOTE — Procedures (Signed)
Physician Interpretation:     [facility] POLYSOMNOGRAPHY  INTERPRETATION REPORT   STUDY DATE:  10/12/2022     PATIENT NAME:  Deborah Perkins         DATE OF BIRTH:  Oct 01, 1960  PATIENT ID:  [id number]    TYPE OF STUDY:  PSG  READING PHYSICIAN: Star Age, MD, PhD   SCORING TECHNICIAN: Gaylyn Cheers, RPSGT  Referred by: Prince Solian, MD   History and Indication for Testing: 62 year old female with an underlying medical history of diverticulosis, cholelithiasis,?s/p ERCP and?cholecystectomy in September 2022?bladder dysfunction, hyperlipidemia, osteopenia, RLS, seasonal allergies, thyroid disease, low back pain,?status post lumbar spine surgery in October 2023,?history of liver abscess,?status post PICC line and long-term antibiotic treatment in 2022,?degenerative joint disease, and obesity, who reports snoring and excessive daytime somnolence. ?Her Epworth sleepiness score is 11 out of 24, fatigue severity score is 33 out of 63. Height: 67 in Weight: 245 lb (BMI 38) Neck Size: 16 in   MEDICATIONS: Ventolin HFA, Xanax, Biotin, Wellbitrin, Zyrtec, Mycelex, Voltaren, Flonase, Neurontin, Synthroid, Protonix, Requip, Zoloft, Valtrex, Ambien   TECHNICAL DESCRIPTION: A registered sleep technologist was in attendance for the duration of the recording.  Data collection, scoring, video monitoring, and reporting were performed in compliance with the AASM Manual for the Scoring of Sleep and Associated Events; (Hypopnea is scored based on the criteria listed in Section VIII D. 1b in the AASM Manual V2.6 using a 4% oxygen desaturation rule or Hypopnea is scored based on the criteria listed in Section VIII D. 1a in the AASM Manual V2.6 using 3% oxygen desaturation and /or arousal rule).   SLEEP CONTINUITY AND SLEEP ARCHITECTURE:  Lights-out was at 22:09: and lights-on at  04:39:, with total recording time of 6 hours 30 min. Total sleep time ( TST) was 335.5 minutes with a  normal sleep efficiency at 86.0%.    BODY POSITION:  TST was divided  between the following sleep positions: 27.0% supine;  73.0% lateral;  0% prone. Duration of total sleep and percent of total sleep in their respective position is as follows: supine 90 minutes (27%), non-supine 245 minutes (73%); right 51 minutes (15%), left 193 minutes (58%), and prone 00 minutes (0%).  Total supine REM sleep time was 17 minutes (48% of total REM sleep).  Sleep latency was normal at 9.0 minutes.  REM sleep latency was increased at 213.5 minutes. Of the total sleep time, the percentage of stage N1 sleep was 11.3%, which is increased, stage N2 sleep was 76%, which is increased, stage N3 sleep was 1.9%, and REM sleep was 10.6%, which is reduced. Wake after sleep onset (WASO) time accounted for 45.5 minutes with minimal to moderate sleep fragmentation noted.   RESPIRATORY MONITORING:   Based on CMS criteria (using a 4% oxygen desaturation rule for scoring hypopneas), there were 53 apneas (49 obstructive; 0 central; 4 mixed), and 29 hypopneas.  Apnea index was 9.5. Hypopnea index was 5.2. The apnea-hypopnea index was 14.7 overall (21.2 supine, 29 non-supine; 37.2 REM, 45.9 supine REM).  There were 0 respiratory effort-related arousals (RERAs).  The RERA index was 0 events/h. Total respiratory disturbance index (RDI) was 14.7 events/h. RDI results showed: supine RDI  21.2 /h; non-supine RDI 12.2 /h; REM RDI 37.2 /h, supine REM RDI 45.9 /h.   Based on AASM criteria (using a 3% oxygen desaturation and /or arousal rule for scoring hypopneas), there were 53 apneas (49 obstructive; 0 central; 4 mixed), and 43 hypopneas. Apnea index  was 9.5. Hypopnea index was 7.7. The apnea-hypopnea index was 17.2/hour overall (21.9/hour supine, 29 non-supine; 37.2/hour during REM sleep, 45.9 supine REM).  There were 0 respiratory effort-related arousals (RERAs).  The RERA index was 0 events/h. Total respiratory disturbance index (RDI) was 17.2  events/h. RDI results showed: supine RDI  21.9 /h; non-supine RDI 15.4 /h; REM RDI 37.2 /h, supine REM RDI 45.9 /h.   OXIMETRY: Oxyhemoglobin Saturation Nadir during sleep was at  66%) from a mean of 91%.  Of the Total sleep time (TST)   hypoxemia (=<88%) was present for  38.8 minutes, or 11.6% of total sleep time.   LIMB MOVEMENTS: There were 64 periodic limb movements of sleep (11.4/hr), of which 14 (2.5/hr) were associated with an arousal.  AROUSAL: There were 133 arousals in total, for an arousal index of 24 arousals/hour.  Of these, 46 were identified as respiratory-related arousals (8 /h), 14 were PLM-related arousals (3 /h), and 68 were non-specific arousals (12 /h).  EEG: Review of the EEG showed no abnormal electrical discharges and symmetrical bihemispheric findings.    EKG: The EKG revealed normal sinus rhythm (NSR). The average heart rate during sleep was 87 bpm.   AUDIO/VIDEO REVIEW: The audio and video review did not show any abnormal or unusual behaviors, movements, phonations or vocalizations. The patient took 2 restroom breaks.  Mild snoring was noted.  POST-STUDY QUESTIONNAIRE: Post study, the patient indicated, that sleep was the same  as usual.   IMPRESSION:  1. Obstructive Sleep Apnea (OSA) 2. Dysfunctions associated with sleep stages or arousal from sleep  RECOMMENDATIONS:  1. This study demonstrates moderate to severe obstructive sleep apnea, with a total AHI of 17.2/hour, REM AHI of 37.2/hour, supine AHI of 21.9/hour and O2 nadir of 66%. Treatment with positive airway pressure in the form of CPAP is recommended. This will require - ideally - a full night titration study to optimize therapy settings, mask fit, monitoring of tolerance and of proper oxygen saturations. For now, the patient will be advised to proceed with home autoPAP therapy. Other treatment options may be limited, and may include (generally speaking) surgical options in selected patients or the use of an  oral appliance in certain patients. Concomitant weight loss is recommended. Please note that untreated obstructive sleep apnea may carry additional perioperative morbidity. Patients with significant obstructive sleep apnea should receive perioperative PAP therapy and the surgeons and particularly the anesthesiologist should be informed of the diagnosis and the severity of the sleep disordered breathing. 2. This study shows sleep fragmentation and abnormal sleep stage percentages; these are nonspecific findings and per se do not signify an intrinsic sleep disorder or a cause for the patient's sleep-related symptoms. Causes include (but are not limited to) the first night effect of the sleep study, circadian rhythm disturbances, medication effect or an underlying mood disorder or medical problem.  3. The patient should be cautioned not to drive, work at heights, or operate dangerous or heavy equipment when tired or sleepy. Review and reiteration of good sleep hygiene measures should be pursued with any patient. 4. The patient will be seen in follow-up in the sleep clinic at [facility] for discussion of the test results, symptom and treatment compliance review, further management strategies, etc. The patient and her referring provider will be notified of the test results.    I certify that I have reviewed the entire raw data recording prior to the issuance of this report in accordance with the Standards of Accreditation of the American  Academy of Sleep Medicine (AASM).  Star Age, MD, PhD Medical Director, [place] [facility]) [place], ABPN (Neurology and Sleep)               Technical Report:   General Information  Name: Deborah Perkins, Deborah Perkins BMI: 38.37 Physician: ,   ID: [id number] Height: 67.0 in Technician: Gaylyn Cheers  Sex: Female Weight: 245.0 lb Record: [id number]  Age: 57 [10/21/1960] Date: 10/12/2022 Scorer: Gaylyn Cheers  Medical & Medication History     Deborah Perkins is a 62 year old female with an underlying medical history of diverticulosis, cholelithiasis, s/p ERCP and cholecystectomy in September 2022 bladder dysfunction, hyperlipidemia, osteopenia, RLS, seasonal allergies, thyroid disease, low back pain, status post lumbar spine surgery in October 2023, history of liver abscess, status post PICC line and long-term antibiotic treatment in 2022, degenerative joint disease, and obesity, who reports snoring and excessive daytime somnolence. Her Epworth sleepiness score is 11 out of 24, fatigue severity score is 33 out of 63.  Ventolin HFA, Xanax, Biotin, Wellbitrin, Zyrtec, Mycelex, Voltaren, Flonase, Neurontin, Synthroid, Protonix, Requip, Zoloft, Valtrex, Ambien   Sleep Disorder      Comments   Patient arrived for a diagnostic polysomnogram. Procedure explained and all questions answered. Standard paste setup without complications. Patient slept supine, right and left. Mild snoring was heard. Frequent respiratory events observed. Significant desaturations observed late in the night, insufficient time left to SPLIT study. No obvious cardiac arrhythmias noted. PLMS observed. Patient had two restroom visits.    Lights out: 10:09:41 PM Lights on: 04:39:44 AM   Time Total Supine Side Prone Upright  Recording (TRT) 6h 30.612m1h 51.049mh 39.79m68m 0.79m 7m0.79m  22mep (TST) 5h 35.12m 1h36m.12m 4h 77mm 0h 0379m 0h 0.64m  Late2m N1 N2 N3 REM Onset Per. Slp. Eff.  Actual 0h 9.79m 0h 14.566mh 49.79m679m 33.12m 86m9.79m 0h6m.79m 86312m%   Stg79mr Wake N1 N2 N3 REM  Total 43.0 38.0 255.5 6.5 35.5  Supine 17.5 15.0 58.5 0.0 17.0  Side 25.5 23.0 197.0 6.5 18.5  Prone 0.0 0.0 0.0 0.0 0.0  Upright 0.0 0.0 0.0 0.0 0.0   Stg % Wake N1 N2 N3 REM  Total 11.4 11.3 76.2 1.9 10.6  Supine 4.6 4.5 17.4 0.0 5.1  Side 6.7 6.9 58.7 1.9 5.5  Prone 0.0 0.0 0.0 0.0 0.0  Upright 0.0 0.0 0.0 0.0 0.0     Apnea Summary Sub Supine Side Prone Upright  Total 53 Total 53 25 28 0 0     REM 16 13 3 $ 0 0    NREM 37 12 25 0 0  Obs 49 REM 16 13 3 $ 0 0    NREM 33 12 21 0 0  Mix 4 REM 0 0 0 0 0    NREM 4 0 4 0 0  Cen 0 REM 0 0 0 0 0    NREM 0 0 0 0 0   Rera Summary Sub Supine Side Prone Upright  Total 0 Total 0 0 0 0 0    REM 0 0 0 0 0    NREM 0 0 0 0 0   Hypopnea Summary Sub Supine Side Prone Upright  Total 43 Total 43 8 35 0 0    REM 6 0 6 0 0    NREM 37 8 29 0 0   4% Hypopnea Summary Sub Supine Side Prone Upright  Total (4%) 29 Total 29 7 22 $ 0  0    REM 6 0 6 0 0    NREM 23 7 16 $ 0 0     AHI Total Obs Mix Cen  17.17 Apnea 9.48 8.76 0.72 0.00   Hypopnea 7.69 -- -- --  14.66 Hypopnea (4%) 5.19 -- -- --    Total Supine Side Prone Upright  Position AHI 17.17 21.88 15.43 0.00 0.00  REM AHI 37.18   NREM AHI 14.80   Position RDI 17.17 21.88 15.43 0.00 0.00  REM RDI 37.18   NREM RDI 14.80    4% Hypopnea Total Supine Side Prone Upright  Position AHI (4%) 14.66 21.22 12.24 0.00 0.00  REM AHI (4%) 37.18   NREM AHI (4%) 12.00   Position RDI (4%) 14.66 21.22 12.24 0.00 0.00  REM RDI (4%) 37.18   NREM RDI (4%) 12.00    Desaturation Information Threshold: 2% <100% <90% <80% <70% <60% <50% <40%  Supine 71.0 26.0 14.0 5.0 0.0 0.0 0.0  Side 166.0 92.0 1.0 0.0 0.0 0.0 0.0  Prone 0.0 0.0 0.0 0.0 0.0 0.0 0.0  Upright 0.0 0.0 0.0 0.0 0.0 0.0 0.0  Total 237.0 118.0 15.0 5.0 0.0 0.0 0.0  Index 37.6 18.7 2.4 0.8 0.0 0.0 0.0   Threshold: 3% <100% <90% <80% <70% <60% <50% <40%  Supine 54.0 26.0 14.0 5.0 0.0 0.0 0.0  Side 117.0 79.0 1.0 0.0 0.0 0.0 0.0  Prone 0.0 0.0 0.0 0.0 0.0 0.0 0.0  Upright 0.0 0.0 0.0 0.0 0.0 0.0 0.0  Total 171.0 105.0 15.0 5.0 0.0 0.0 0.0  Index 27.1 16.6 2.4 0.8 0.0 0.0 0.0   Threshold: 4% <100% <90% <80% <70% <60% <50% <40%  Supine 37.0 26.0 14.0 5.0 0.0 0.0 0.0  Side 74.0 59.0 1.0 0.0 0.0 0.0 0.0  Prone 0.0 0.0 0.0 0.0 0.0 0.0 0.0  Upright 0.0 0.0 0.0 0.0 0.0 0.0 0.0  Total 111.0 85.0 15.0 5.0 0.0 0.0 0.0  Index 17.6 13.5 2.4 0.8 0.0 0.0  0.0   Threshold: 4% <100% <90% <80% <70% <60% <50% <40%  Supine 37 26 14 5 $ 0 0 0  Side 74 59 1 0 0 0 0  Prone 0 0 0 0 0 0 0  Upright 0 0 0 0 0 0 0  Total 111 85 15 5 0 0 0   Awakening/Arousal Information # of Awakenings 32  Wake after sleep onset 45.59m Wake after persistent sleep 42.024m Arousal Assoc. Arousals Index  Apneas 30 5.4  Hypopneas 16 2.9  Leg Movements 40 7.2  Snore 0 0.0  PTT Arousals 0 0.0  Spontaneous 68 12.2  Total 153 27.4  Leg Movement Information PLMS LMs Index  Total LMs during PLMS 64 11.4  LMs w/ Microarousals 14 2.5   LM LMs Index  w/ Microarousal 26 4.6  w/ Awakening 6 1.1  w/ Resp Event 3 0.5  Spontaneous 33 5.9  Total 61 10.9     Desaturation threshold setting: 4% Minimum desaturation setting: 10 seconds SaO2 nadir: 66% The longest event was a 53 sec obstructive Apnea with a minimum SaO2 of 68%. The lowest SaO2 was 50% associated with a 17 sec obstructive Apnea. EKG Rates EKG Avg Max Min  Awake 87 106 77  Asleep 87 100 69  EKG Events: Tachycardia

## 2022-10-22 ENCOUNTER — Ambulatory Visit (HOSPITAL_COMMUNITY)
Admission: RE | Admit: 2022-10-22 | Discharge: 2022-10-22 | Disposition: A | Discharge status: Home / Self Care | Payer: No Typology Code available for payment source | Source: Ambulatory Visit | Attending: Vascular Surgery | Admitting: Vascular Surgery

## 2022-10-22 ENCOUNTER — Other Ambulatory Visit (HOSPITAL_COMMUNITY): Payer: Self-pay | Admitting: Adult Health

## 2022-10-22 ENCOUNTER — Encounter: Payer: Self-pay | Admitting: Neurology

## 2022-10-22 DIAGNOSIS — M79662 Pain in left lower leg: Secondary | ICD-10-CM

## 2022-10-22 NOTE — Addendum Note (Signed)
Addended by: Star Age on: 10/22/2022 12:29 PM   Modules accepted: Orders

## 2022-10-25 NOTE — Telephone Encounter (Signed)
Star Age, MD 10/22/2022 12:29 PM EST Back to Top    Patient referred by Dr. Dagmar Hait, seen by me on 08/24/22, diagnostic PSG on 10/12/22.     Please call and notify the patient that the recent sleep study did confirm the diagnosis of obstructive sleep apnea. OSA is in the moderate to severe range. I recommend treatment in the form of autoPAP, which means, that we don't have to bring her back for a second sleep study with CPAP, but will let him try an autoPAP machine at home, through a DME company (of her choice, or as per insurance requirement). The DME representative will educate her on how to use the machine, how to put the mask on, etc. I have placed an order in the chart. Please send referral, talk to patient, send report to referring MD. We will need a FU in sleep clinic for 10 weeks post-PAP set up, please arrange that with me or one of our NPs. Thanks,   Star Age, MD, PhD [facility])

## 2022-10-26 NOTE — Telephone Encounter (Signed)
Autopap referral faxed to [facility] and community message sent there as well. Received a receipt of confirmation.

## 2022-10-26 NOTE — Telephone Encounter (Signed)
[facility] confirmed receipt of order.

## 2023-01-03 ENCOUNTER — Telehealth (INDEPENDENT_AMBULATORY_CARE_PROVIDER_SITE_OTHER): Payer: No Typology Code available for payment source | Admitting: Neurology

## 2023-01-03 ENCOUNTER — Telehealth: Payer: Self-pay | Admitting: *Deleted

## 2023-01-03 ENCOUNTER — Telehealth: Payer: Self-pay | Admitting: Neurology

## 2023-01-03 DIAGNOSIS — G4733 Obstructive sleep apnea (adult) (pediatric): Secondary | ICD-10-CM

## 2023-01-03 NOTE — Telephone Encounter (Signed)
Cpap report placed on Dr Teofilo Pod desk.

## 2023-01-03 NOTE — Telephone Encounter (Signed)
Noted  

## 2023-01-03 NOTE — Telephone Encounter (Signed)
..   Pt understands that although there may be some limitations with this type of visit, we will take all precautions to reduce any security or privacy concerns.  Pt understands that this will be treated like an in office visit and we will file with pt's insurance, and there may be a patient responsible charge related to this service. ? ?

## 2023-01-03 NOTE — Patient Instructions (Signed)
It was nice to see you again today. I am glad to hear, things are going well with your autoPAP therapy. You have adjusted well to treatment with your new machine, and you are compliant with it. You have also fulfilled the insurance-mandated compliance percentage, which is reassuring, so you can get ongoing supplies through your insurance. Please talk to your DME provider about getting replacement supplies on a regular basis. Please be sure to change your filter every month, your mask about every 3 months, hose about every 6 months, humidifier chamber about yearly. Some restrictions are imposed by your insurance carrier with regard to how frequently you can get certain supplies.  Your DME company can provide further details if necessary.   Please continue using your autoPAP regularly. While your insurance requires that you use PAP at least 4 hours each night on 70% of the nights, I recommend, that you not skip any nights and use it throughout the night if you can. Getting used to PAP and staying with the treatment long term does take time and patience and discipline. Untreated obstructive sleep apnea when it is moderate to severe can have an adverse impact on cardiovascular health and raise her risk for heart disease, arrhythmias, hypertension, congestive heart failure, stroke and diabetes. Untreated obstructive sleep apnea causes sleep disruption, nonrestorative sleep, and sleep deprivation. This can have an impact on your day to day functioning and cause daytime sleepiness and impairment of cognitive function, memory loss, mood disturbance, and problems focussing. Using PAP regularly can improve these symptoms.  We can see you in 1 year, you can see one of our nurse practitioners as you are stable.   

## 2023-01-03 NOTE — Telephone Encounter (Signed)
Please call patient and schedule a 1 year f/u with Dr Frances Furbish. Plan for in-office visit at this time.

## 2023-01-03 NOTE — Progress Notes (Signed)
Interim history:   Ms. Vanzile is a 63 year old female with an underlying medical history of diverticulosis, cholelithiasis, s/p ERCP and cholecystectomy in September 2022 bladder dysfunction, hyperlipidemia, osteopenia, RLS, seasonal allergies, thyroid disease, low back pain, status post lumbar spine surgery in October 2023, history of liver abscess, status post PICC line and long-term antibiotic treatment in 2022, degenerative joint disease, and obesity, who presents for a MyChart video visit follow-up consultation of her obstructive sleep apnea after interim testing and starting home AutoPap therapy.  The patient is accompanied by her husband today and joins via tablet computer from her home in [place], her family friend is also with them and she was agreeable to having this appointment with her husband and her family friend present. I am located at my office at [facility], utilizing my work [employer].  I saw her on 08/24/2022 at the request of her primary care physician, at which time she reported snoring and excessive daytime somnolence.  She was advised to proceed with a sleep study.  She had a baseline sleep study through our sleep lab on 10/12/2022 which showed moderate to severe obstructive sleep apnea, with a total AHI of 17.2/hour, REM AHI of 37.2/hour, supine AHI of 21.9/hour and O2 nadir of 66%.  She was advised to start home AutoPap therapy.  Her set up date was 11/02/2022.  She has a ResMed air sense 11 AutoSet machine, her DME company is [employer].  Today, 01/03/2023: I reviewed her AutoPap compliance data from 11/07/2022 through 12/06/2022, which is a total of 30 days, during which time she used her machine 25 days with percent use days greater than 4 hours at 77%, indicating good compliance with an average usage of 6 hours and 30 minutes, residual AHI at goal at 2.1/h, average pressure for the 95th percentile at 11 cm with a range of 5 to 12 cm with EPR of 2.  Leak acceptable with  significant fluctuation, 95th percentile at 16.6 L/min.  She reports overall doing well with the machine.  She certainly intends to continue with treatment and has benefited from it and that her sleep quality and sleep consolidation are better, she seems to have more dream sleep and snores less.  She is using a nasal mask with fairly good success and was wondering if she needed a different mask.  She has had some trouble with excess moisture around the mask and needs to dab it in the middle of the nigh  Overall, she has been adjusting fairly well.  She had a temperature today and did not make it physically to the appointment for that reason.  She has had some diarrhea.    Previously:   08/24/22: (She) reports snoring and excessive daytime somnolence.  Her Epworth sleepiness score is 11 out of 24, fatigue severity score is 33 out of 63.  I reviewed your office note from 01/12/2022.  I had evaluated her several years ago for sleep apnea concern.  A sleep study on 07/20/2013 showed an AHI of 4/h, O2 nadir 85%.  She has not been on PAP therapy.  She has had interim weight gain.  At the time of her sleep study in 2014 she weighed 227 lb, currently 245 pounds. She reports nonrestorative sleep, worsening daytime somnolence.  She goes to bed around 10 and rise time is around 7.  She has nocturia about 2-3 times per average night, denies recurrent morning headaches.  She lives with her husband, she currently does not work outside  the home, takes care of her husband.  She lives most of the time in [place], she also visits her daughter and her family in [place], [place].  She also helps take care of her elderly parents.  She takes Ambien generic 5 mg strength as needed.  She is a non-smoker.    10/03/2013: 62 year old right-handed woman with an underlying medical history of allergic rhinitis, depression, bladder dysfunction, degenerative joint disease, hyperlipidemia, obesity, hypothyroidism,  and varicose veins (s/p laser surgery), who returns for followup consultation of her sleep disorder. She is unaccompanied today. I first met her on 07/11/2013, and which time she complained of non-restorative sleep, snoring as well as restless sleep. She denied frank restless leg symptoms but had been told that at times she jerks or kicks in her sleep. She reported trouble with her right hip at times and often sleeps with a wedge or pillow between both knees. She is a side sleeper. She does wake up several times per night but does not have to go to the bathroom regularly. She denied morning headaches and parasomnias. She reported tooth grinding. She has recently seen her dentist who wanted to hold off on providing her with a bite guard until after her sleep study because she may end up using a oral appliance for OSA. I suggested further evaluation with a sleep study. She had a baseline sleep study on 07/20/2013 and I went over the test results with her in detail today. We also called her in November with the test results. Her sleep efficiency was reduced at 83.7% with a latency to sleep of 55.5 minutes and wake after sleep onset of 7.5 minutes with mild sleep fragmentation noted. She had a mildly elevated arousal index, primarily because of periodic leg movements. She had a normal percentage of stage I sleep, an increased percentage of stage II sleep, and decreased percentage of slow-wave sleep, and a normal percentage of REM sleep with a normal REM latency. Moderate periodic leg movements were noted with 28.36 per hour resulting in 5.9 arousals per hour. EKG was normal. EEG was normal. Mild to moderate snoring was noted. She had a total AHI of 4 per hour. She had a baseline oxygen saturation of 93%, with a nadir of 85%. She spent 13 minutes and 11 seconds below the saturation of 90%. Time below 88% saturation was 8 minutes and 24 seconds.   Today, she reports having had the flu recently and has a check up  tomorrow with Dr. Felipa Eth. She was treated with a Z pack and steroid shot and oral prednisone. She had fever up to 102. She is trying to lose and has started Nutrisystem. She was able to lose weight without before. She has also essentially stopped taking her Zoloft. She reduced it to every other day for a while and now is off of it for the past 2-3 weeks perhaps. She was worried about it causing her weight gain. Again, she does not endorse any restless legs syndrome type symptoms. Her husband has told her that she kicks in her sleep. She has also been given Nasacort nasal spray.     Her Past Medical History Is Significant For: Past Medical History:  Diagnosis Date   Bladder dysfunction    chronic   Depression    Diverticulosis    DJD (degenerative joint disease)    DJD (degenerative joint disease)    Fibrocystic breast disease    Gallstones    Hyperlipidemia  Obesity    OSA (obstructive sleep apnea)    mild   Osteopenia    Restless leg syndrome    Seasonal allergies    Thyroid disease    Varicose veins of both legs with edema     Her Past Surgical History Is Significant For: Past Surgical History:  Procedure Laterality Date   APPENDECTOMY  09/06/1988   done when operated for malrotation,    cervical polyp removal  1999   CHOLECYSTECTOMY     05-12-2021   ERCP     05-11-2021   FOOT SURGERY     multiple surgeries on both feet   LUMBAR SPINE SURGERY     07-05-2022   malrotation of intestines  07/07/1989   Ladd procedure by Dr Wiliam Ke, incidental appendectomy done    Her Family History Is Significant For: Family History  Problem Relation Age of Onset   Breast cancer Mother 71   Bipolar disorder Mother    Hypertension Father    Stroke Paternal Uncle    Colon cancer Neg Hx    Esophageal cancer Neg Hx    Rectal cancer Neg Hx    Stomach cancer Neg Hx     Her Social History Is Significant For: Social History   Socioeconomic History   Marital status: Married    Spouse  name: Not on file   Number of children: Not on file   Years of education: Not on file   Highest education level: Not on file  Occupational History   Occupation: homemaker  Tobacco Use   Smoking status: Never   Smokeless tobacco: Never  Vaping Use   Vaping Use: Never used  Substance and Sexual Activity   Alcohol use: Yes    Alcohol/week: 3.0 standard drinks of alcohol    Types: 3 Standard drinks or equivalent per week    Comment: glass of red wine   Drug use: No   Sexual activity: Yes    Partners: Male    Birth control/protection: Post-menopausal  Other Topics Concern   Not on file  Social History Narrative   Caffiene 2 cups daily, soda occasional sprite.   Social Determinants of Health   Financial Resource Strain: Not on file  Food Insecurity: Not on file  Transportation Needs: Not on file  Physical Activity: Not on file  Stress: Not on file  Social Connections: Not on file    Her Allergies Are:  No Known Allergies:   Her Current Medications Are:  Outpatient Encounter Medications as of 01/03/2023  Medication Sig   albuterol (VENTOLIN HFA) 108 (90 Base) MCG/ACT inhaler Inhale 2 puffs into the lungs. Every 4-6 hours as needed   ALPRAZolam (XANAX) 0.25 MG tablet Take 0.25 mg by mouth 3 (three) times daily as needed.   BIOTIN PO Take 1,000 mg by mouth daily.   buPROPion (WELLBUTRIN XL) 300 MG 24 hr tablet Take 300 mg by mouth daily.   cetirizine (ZYRTEC) 10 MG tablet Take 10 mg by mouth daily.   clotrimazole (MYCELEX) 10 MG troche 10 mg daily as needed. Mouth/throat   diclofenac sodium (VOLTAREN) 1 % GEL daily as needed.   fluticasone (FLONASE) 50 MCG/ACT nasal spray Place 2 sprays into both nostrils daily.   gabapentin (NEURONTIN) 100 MG capsule Take 100 mg by mouth daily.   levothyroxine (SYNTHROID) 88 MCG tablet Take by mouth.   pantoprazole (PROTONIX) 40 MG tablet Take 1 tablet (40 mg total) by mouth daily. (Patient taking differently: Take 40 mg by mouth daily.  1  capsule every AM)   rOPINIRole (REQUIP) 0.5 MG tablet Take 1.5 mg by mouth. 2-3 hours before bedtime.   sertraline (ZOLOFT) 100 MG tablet Take 100 mg by mouth daily.   valACYclovir (VALTREX) 1000 MG tablet Take 2 tablets by mouth at 1st sight of fever blister and 2 tablets every 8-12 hours later   zolpidem (AMBIEN) 10 MG tablet Take 10 mg by mouth at bedtime as needed.   Facility-Administered Encounter Medications as of 01/03/2023  Medication   0.9 %  sodium chloride infusion  :  Review of Systems:  Out of a complete 14 point review of systems, all are reviewed and negative with the exception of these symptoms as listed below:  Virtual Visit via Video Note on 01/03/2023:   I connected with Stevphen [place] Ky on 01/03/23 at  1:45 PM EDT by a video enabled telemedicine application and verified that I am speaking with the correct person using two identifiers.   I discussed the limitations of evaluation and management by telemedicine and the availability of in person appointments. The patient expressed understanding and agreed to proceed.  History of Present Illness: See above.   Observations/Objective: Pleasant, conversant, no acute distress, speaking in full sentences, no obvious shortness of breath.  She has normal facial symmetry, speech is clear without dysarthria, hypophonia or voice tremor.  Upper body movements are normal.  Tracking appears to be normal.  Assessment and Plan:  In summary, Tulani Kidney Russi is a very pleasant 62 y.o.-year old female with an underlying medical history of diverticulosis, cholelithiasis, s/p ERCP and cholecystectomy in September 2022 bladder dysfunction, hyperlipidemia, osteopenia, RLS, seasonal allergies, thyroid disease, low back pain, status post lumbar spine surgery in October 2023, history of liver abscess, status post PICC line and long-term antibiotic treatment in 2022, degenerative joint disease, and obesity, who presents for a MyChart video visit  follow-up consultation of her obstructive sleep apnea after interim testing and starting home AutoPap therapy.  She had a baseline sleep study through our sleep lab on 10/12/2022 which showed moderate to severe obstructive sleep apnea, with a total AHI of 17.2/hour, REM AHI of 37.2/hour, supine AHI of 21.9/hour and O2 nadir of 66%.  She was advised to start home AutoPap therapy.  Her set up date was 11/02/2022.  She has a ResMed air sense 11 AutoSet machine, her DME company is [employer]. She has had a few lapses in treatment.  She is overall compliant with treatment and has fulfilled the insurance mandated compliance as well in the first 90 days.  She is motivated to continue with treatment and has benefited from it.  She is commended for treatment adherence.  Apnea scores are good, we talked about her sleep study results and reviewed her compliance data together as well.  She is advised to talk to her DME provider about adjusting the humidity setting.  She is advised that she can continue with a nasal mask as her leak is not very high from what I can see in her download.  She is advised to follow-up routinely in this clinic in 1 year.  She is advised to try not to skip any nights and be consistent with her treatment.  I answered all their questions today and the patient and her husband were in agreement.    Huston Foley, MD, PhD  Follow Up Instructions:    I discussed the assessment and treatment plan with the patient. The patient was provided an opportunity to ask questions and  all were answered. The patient agreed with the plan and demonstrated an understanding of the instructions.   The patient was advised to call back or seek an in-person evaluation if the symptoms worsen or if the condition fails to improve as anticipated.  I provided 30 minutes of non-face-to-face time during this encounter.   Huston Foley, MD

## 2023-01-04 NOTE — Telephone Encounter (Signed)
Called pt. Scheduled her on 01/04/2024 @ 10:45 am with Dr. Frances Furbish.

## 2023-01-06 ENCOUNTER — Ambulatory Visit: Payer: No Typology Code available for payment source | Admitting: Family Medicine

## 2023-03-28 ENCOUNTER — Encounter: Payer: Self-pay | Admitting: Gastroenterology

## 2023-06-16 ENCOUNTER — Ambulatory Visit: Payer: No Typology Code available for payment source | Admitting: Family Medicine

## 2024-01-03 ENCOUNTER — Telehealth: Payer: Self-pay

## 2024-01-03 NOTE — Telephone Encounter (Signed)
 Called pt and asked pt to bring her CPAP Machine with her to her appointment. Pt states that she can't use her machine and doesn't use her machine.

## 2024-01-04 ENCOUNTER — Ambulatory Visit: Payer: No Typology Code available for payment source | Admitting: Neurology

## 2024-08-15 ENCOUNTER — Other Ambulatory Visit (HOSPITAL_COMMUNITY): Payer: Self-pay | Admitting: Internal Medicine

## 2024-08-15 DIAGNOSIS — R0600 Dyspnea, unspecified: Secondary | ICD-10-CM

## 2024-09-05 ENCOUNTER — Other Ambulatory Visit (HOSPITAL_BASED_OUTPATIENT_CLINIC_OR_DEPARTMENT_OTHER): Payer: Self-pay | Admitting: Internal Medicine

## 2024-09-05 DIAGNOSIS — R0600 Dyspnea, unspecified: Secondary | ICD-10-CM

## 2024-09-07 ENCOUNTER — Other Ambulatory Visit: Payer: Self-pay | Admitting: Internal Medicine

## 2024-09-07 DIAGNOSIS — Z1231 Encounter for screening mammogram for malignant neoplasm of breast: Secondary | ICD-10-CM

## 2024-09-12 ENCOUNTER — Ambulatory Visit

## 2024-09-12 ENCOUNTER — Ambulatory Visit
Admission: RE | Admit: 2024-09-12 | Discharge: 2024-09-12 | Disposition: A | Discharge status: Home / Self Care | Source: Ambulatory Visit | Attending: Internal Medicine | Admitting: Internal Medicine

## 2024-09-12 DIAGNOSIS — Z1231 Encounter for screening mammogram for malignant neoplasm of breast: Secondary | ICD-10-CM

## 2024-09-19 ENCOUNTER — Encounter (HOSPITAL_BASED_OUTPATIENT_CLINIC_OR_DEPARTMENT_OTHER): Payer: Self-pay

## 2024-09-20 ENCOUNTER — Other Ambulatory Visit (HOSPITAL_BASED_OUTPATIENT_CLINIC_OR_DEPARTMENT_OTHER)

## 2024-09-20 DIAGNOSIS — R0600 Dyspnea, unspecified: Secondary | ICD-10-CM

## 2024-09-20 LAB — ECHOCARDIOGRAM COMPLETE
Area-P 1/2: 4.89 cm2
S' Lateral: 3.03 cm

## 2024-10-03 ENCOUNTER — Ambulatory Visit (HOSPITAL_BASED_OUTPATIENT_CLINIC_OR_DEPARTMENT_OTHER)
Admission: RE | Admit: 2024-10-03 | Discharge: 2024-10-03 | Disposition: A | Discharge status: Home / Self Care | Payer: Self-pay | Source: Ambulatory Visit | Attending: Internal Medicine | Admitting: Internal Medicine

## 2024-10-03 DIAGNOSIS — R0600 Dyspnea, unspecified: Secondary | ICD-10-CM | POA: Insufficient documentation
